# Patient Record
Sex: Female | Born: 1985 | Race: White | Hispanic: No | Marital: Single
Health system: Midwestern US, Community
[De-identification: ages and names within clinical notes are randomized; demographics above are authoritative.]

## PROBLEM LIST (undated history)

## (undated) DIAGNOSIS — F1910 Other psychoactive substance abuse, uncomplicated: Secondary | ICD-10-CM

## (undated) DIAGNOSIS — L0291 Cutaneous abscess, unspecified: Secondary | ICD-10-CM

## (undated) DIAGNOSIS — J45909 Unspecified asthma, uncomplicated: Secondary | ICD-10-CM

---

## 2016-10-31 ENCOUNTER — Inpatient Hospital Stay
Admit: 2016-10-31 | Discharge: 2016-10-31 | Disposition: A | Discharge status: Rehabilitation Facility | Attending: Aerospace Medicine

## 2016-10-31 DIAGNOSIS — F1123 Opioid dependence with withdrawal: Secondary | ICD-10-CM

## 2016-10-31 MED ORDER — CLONIDINE 0.1 MG TAB
0.1 mg | ORAL | Status: DC
Start: 2016-10-31 — End: 2016-10-31
  Administered 2016-10-31: 19:00:00 via ORAL

## 2016-10-31 MED ORDER — HYDROXYZINE 25 MG TAB
25 mg | ORAL | Status: DC
Start: 2016-10-31 — End: 2016-10-31

## 2016-10-31 MED ORDER — CLONIDINE 0.1 MG TAB
0.1 mg | ORAL | Status: DC
Start: 2016-10-31 — End: 2016-10-31

## 2016-10-31 MED ORDER — PROMETHAZINE 25 MG TAB
25 mg | ORAL | Status: DC
Start: 2016-10-31 — End: 2016-10-31

## 2016-10-31 MED ORDER — DICYCLOMINE 10 MG CAP
10 mg | ORAL | Status: DC
Start: 2016-10-31 — End: 2016-10-31

## 2016-10-31 MED FILL — PROMETHAZINE 25 MG TAB: 25 mg | ORAL | Qty: 4

## 2016-10-31 MED FILL — DICYCLOMINE 10 MG CAP: 10 mg | ORAL | Qty: 3

## 2016-10-31 MED FILL — CLONIDINE 0.1 MG TAB: 0.1 mg | ORAL | Qty: 5

## 2016-10-31 MED FILL — HYDROXYZINE 25 MG TAB: 25 mg | ORAL | Qty: 8

## 2016-10-31 MED FILL — CLONIDINE 0.1 MG TAB: 0.1 mg | ORAL | Qty: 1

## 2016-10-31 NOTE — ED Triage Notes (Signed)
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

## 2016-10-31 NOTE — ED Provider Notes (Signed)
HPI Comments:  31 year old female presents emergency department with her mother hoping to get medically screened for the [facility] facility.  Patient has a history of heroin use.  Last use last night.  Admits to occasional marijuana and alcohol.  Also smokes cigarettes.  Denies any other illicit   Drug use.  Patient was in a program at [facility] 1 year ago.  Had short.  Of sobriety afterward.  Patient has a history of asthma.  She has an albuterol MDI but has not had to use it recently.  She has not been on any recent antibiotics or steroids.  She denies any seizure history.  Denies any history of diabetes or hypertension.  Denies any history of sepsis or heart problems.  Denies pregnancy.    Patient is a 31 y.o. female presenting with intoxication. The history is provided by the patient and a parent. No language interpreter was used.   Alcohol intoxication   There areno confusion, no somnolence, no loss of consciousness, no seizures, no weakness, no agitation, no delusions, no hallucinations, no violence and no intoxication present at this time.  Suspected agents include heroin. Associated symptoms include nausea. Pertinent negatives include no fever and no vomiting. Associated medical issues include addiction treatment.        History reviewed. No pertinent past medical history.    History reviewed. No pertinent surgical history.      History reviewed. No pertinent family history.    Social History     Social History   ??? Marital status: SINGLE     Spouse name: N/A   ??? Number of children: N/A   ??? Years of education: N/A     Occupational History   ??? Not on file.     Social History Main Topics   ??? Smoking status: Current Every Day Smoker   ??? Smokeless tobacco: Not on file   ??? Alcohol use Yes   ??? Drug use: Yes     Special: Heroin   ??? Sexual activity: Not Currently     Other Topics Concern   ??? Not on file     Social History Narrative   ??? No narrative on file          ALLERGIES: Review of patient's allergies indicates no known allergies.    Review of Systems   Constitutional: Negative for chills, diaphoresis and fever.   HENT: Negative for congestion, facial swelling, sinus pain, sinus pressure, sore throat, trouble swallowing and voice change.    Eyes: Negative for photophobia, pain and visual disturbance.   Respiratory: Negative for apnea, cough, choking, chest tightness, shortness of breath, wheezing and stridor.    Cardiovascular: Negative for chest pain, palpitations and leg swelling.   Gastrointestinal: Positive for nausea. Negative for abdominal pain, constipation, diarrhea and vomiting.   Genitourinary: Negative for difficulty urinating, dysuria, flank pain, menstrual problem and pelvic pain.   Musculoskeletal: Negative for arthralgias, back pain, gait problem, joint swelling, myalgias, neck pain and neck stiffness.   Skin: Negative for color change, pallor, rash and wound.   Neurological: Negative for dizziness, tremors, seizures, loss of consciousness, syncope, facial asymmetry, speech difficulty, weakness, light-headedness, numbness and headaches.   Hematological: Negative for adenopathy. Does not bruise/bleed easily.   Psychiatric/Behavioral: Negative for agitation, confusion, dysphoric mood and hallucinations. The patient is not nervous/anxious.        Vitals:    10/31/16 1157   BP: 109/61   Pulse: 85   Resp: 20  Temp: 98.3 ??F (36.8 ??C)   SpO2: 99%   Weight: 52.2 kg (115 lb)            Physical Exam   Constitutional: She is oriented to person, place, and time. She appears well-developed and well-nourished. No distress.   HENT:   Head: Normocephalic and atraumatic.   Nose: Nose normal.   Mouth/Throat: Oropharynx is clear and moist.   Eyes: Conjunctivae and EOM are normal. Pupils are equal, round, and reactive to light.   Neck: Normal range of motion. Neck supple.   Cardiovascular: Normal rate, regular rhythm, normal heart sounds and intact distal pulses.     No murmur heard.  Pulmonary/Chest: Effort normal and breath sounds normal. No respiratory distress. She has no wheezes. She has no rales. She exhibits no tenderness.   Abdominal: Soft. Bowel sounds are normal. There is no tenderness. There is no rebound and no guarding.   Musculoskeletal: Normal range of motion. She exhibits no edema, tenderness or deformity.   Lymphadenopathy:     She has no cervical adenopathy.   Neurological: She is alert and oriented to person, place, and time. No cranial nerve deficit. She exhibits normal muscle tone. Coordination normal.   Skin: Skin is warm and dry. No rash noted. She is not diaphoretic. No erythema. No pallor.   Bilateral forearms with scattered track marks.No erythema, excess warmth, fluctuance or drainage noted.   Psychiatric: She has a normal mood and affect. Her behavior is normal. Judgment and thought content normal.   Nursing note and vitals reviewed.       MDM  Number of Diagnoses or Management Options  Opioid withdrawal [facility]):   Diagnosis management comments:   31 year old female seeking help with detoxification from heroin use.  Patient has been in touch with [facility] already.  Here for medical clearing.  Felt medically cleared for detox.  Will for note and recommendations to [facility].        ED Course   Comment By Time   Patient has been accepted at [facility].  Will provide to go medications. Drucie Opitz, DO 09/26 1427       Procedures    Medications given in the ED:  Medications - No data to display    Diagnosis:    ICD-10-CM ICD-9-CM   1. 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      Medications given in the ED:  Medications - No data to display    Diagnosis:    ICD-10-CM ICD-9-CM   1. 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

## 2016-10-31 NOTE — ED Notes (Signed)
Tara Ho presents to the ED for medical clearance and acceptance to [facility]. Drug of choice is heroin, IV, last use was last night. Denies regular alcohol or recreational benzo use. No history of seizures.

## 2018-11-13 ENCOUNTER — Other Ambulatory Visit: Payer: Self-pay

## 2018-11-13 ENCOUNTER — Encounter (HOSPITAL_COMMUNITY): Payer: Self-pay | Admitting: Emergency Medicine

## 2018-11-13 ENCOUNTER — Inpatient Hospital Stay (HOSPITAL_COMMUNITY)
Admission: EM | Admit: 2018-11-13 | Discharge: 2018-11-20 | DRG: 854 | Disposition: A | Discharge status: Home / Self Care | Payer: Medicaid - Out of State | Attending: Internal Medicine | Admitting: Internal Medicine

## 2018-11-13 ENCOUNTER — Emergency Department (HOSPITAL_COMMUNITY): Payer: Medicaid - Out of State

## 2018-11-13 DIAGNOSIS — Z20828 Contact with and (suspected) exposure to other viral communicable diseases: Secondary | ICD-10-CM | POA: Diagnosis present

## 2018-11-13 DIAGNOSIS — Z681 Body mass index (BMI) 19 or less, adult: Secondary | ICD-10-CM | POA: Diagnosis not present

## 2018-11-13 DIAGNOSIS — E440 Moderate protein-calorie malnutrition: Secondary | ICD-10-CM | POA: Diagnosis present

## 2018-11-13 DIAGNOSIS — Z720 Tobacco use: Secondary | ICD-10-CM

## 2018-11-13 DIAGNOSIS — E871 Hypo-osmolality and hyponatremia: Secondary | ICD-10-CM | POA: Diagnosis present

## 2018-11-13 DIAGNOSIS — L03213 Periorbital cellulitis: Secondary | ICD-10-CM | POA: Diagnosis present

## 2018-11-13 DIAGNOSIS — B9561 Methicillin susceptible Staphylococcus aureus infection as the cause of diseases classified elsewhere: Secondary | ICD-10-CM | POA: Diagnosis not present

## 2018-11-13 DIAGNOSIS — F17200 Nicotine dependence, unspecified, uncomplicated: Secondary | ICD-10-CM | POA: Diagnosis not present

## 2018-11-13 DIAGNOSIS — J4520 Mild intermittent asthma, uncomplicated: Secondary | ICD-10-CM | POA: Diagnosis not present

## 2018-11-13 DIAGNOSIS — R739 Hyperglycemia, unspecified: Secondary | ICD-10-CM | POA: Diagnosis present

## 2018-11-13 DIAGNOSIS — F1910 Other psychoactive substance abuse, uncomplicated: Secondary | ICD-10-CM | POA: Diagnosis present

## 2018-11-13 DIAGNOSIS — D751 Secondary polycythemia: Secondary | ICD-10-CM | POA: Diagnosis present

## 2018-11-13 DIAGNOSIS — B9562 Methicillin resistant Staphylococcus aureus infection as the cause of diseases classified elsewhere: Secondary | ICD-10-CM | POA: Diagnosis not present

## 2018-11-13 DIAGNOSIS — F1990 Other psychoactive substance use, unspecified, uncomplicated: Secondary | ICD-10-CM | POA: Diagnosis not present

## 2018-11-13 DIAGNOSIS — R7881 Bacteremia: Secondary | ICD-10-CM | POA: Diagnosis present

## 2018-11-13 DIAGNOSIS — B9620 Unspecified Escherichia coli [E. coli] as the cause of diseases classified elsewhere: Secondary | ICD-10-CM | POA: Diagnosis present

## 2018-11-13 DIAGNOSIS — R590 Localized enlarged lymph nodes: Secondary | ICD-10-CM | POA: Diagnosis present

## 2018-11-13 DIAGNOSIS — J45909 Unspecified asthma, uncomplicated: Secondary | ICD-10-CM | POA: Diagnosis present

## 2018-11-13 DIAGNOSIS — R8271 Bacteriuria: Secondary | ICD-10-CM | POA: Diagnosis not present

## 2018-11-13 DIAGNOSIS — E872 Acidosis: Secondary | ICD-10-CM | POA: Diagnosis present

## 2018-11-13 DIAGNOSIS — A4902 Methicillin resistant Staphylococcus aureus infection, unspecified site: Secondary | ICD-10-CM | POA: Diagnosis not present

## 2018-11-13 DIAGNOSIS — A4102 Sepsis due to Methicillin resistant Staphylococcus aureus: Secondary | ICD-10-CM | POA: Diagnosis present

## 2018-11-13 DIAGNOSIS — L03211 Cellulitis of face: Secondary | ICD-10-CM | POA: Diagnosis present

## 2018-11-13 DIAGNOSIS — K0889 Other specified disorders of teeth and supporting structures: Secondary | ICD-10-CM | POA: Diagnosis not present

## 2018-11-13 DIAGNOSIS — D72829 Elevated white blood cell count, unspecified: Secondary | ICD-10-CM | POA: Diagnosis not present

## 2018-11-13 DIAGNOSIS — K047 Periapical abscess without sinus: Secondary | ICD-10-CM | POA: Diagnosis present

## 2018-11-13 DIAGNOSIS — R768 Other specified abnormal immunological findings in serum: Secondary | ICD-10-CM | POA: Diagnosis not present

## 2018-11-13 DIAGNOSIS — Q211 Atrial septal defect: Secondary | ICD-10-CM | POA: Diagnosis not present

## 2018-11-13 DIAGNOSIS — L0201 Cutaneous abscess of face: Secondary | ICD-10-CM | POA: Diagnosis present

## 2018-11-13 DIAGNOSIS — J45998 Other asthma: Secondary | ICD-10-CM | POA: Diagnosis not present

## 2018-11-13 DIAGNOSIS — R000 Tachycardia, unspecified: Secondary | ICD-10-CM | POA: Diagnosis not present

## 2018-11-13 DIAGNOSIS — E878 Other disorders of electrolyte and fluid balance, not elsewhere classified: Secondary | ICD-10-CM | POA: Diagnosis present

## 2018-11-13 DIAGNOSIS — K029 Dental caries, unspecified: Secondary | ICD-10-CM | POA: Diagnosis present

## 2018-11-13 HISTORY — DX: Unspecified asthma, uncomplicated: J45.909

## 2018-11-13 HISTORY — DX: Cutaneous abscess, unspecified: L02.91

## 2018-11-13 LAB — LACTIC ACID, PLASMA
Lactic Acid, Venous: 2.1 mmol/L (ref 0.5–1.9)
Lactic Acid, Venous: 1.4 mmol/L (ref 0.5–1.9)
Lactic Acid, Venous: 1.4 mmol/L (ref 0.5–1.9)

## 2018-11-13 LAB — URINALYSIS, ROUTINE W REFLEX MICROSCOPIC
Bacteria, UA: NONE SEEN
Bilirubin Urine: NEGATIVE
Glucose, UA: NEGATIVE mg/dL
Ketones, ur: NEGATIVE mg/dL
Leukocytes,Ua: NEGATIVE
Nitrite: NEGATIVE
Protein, ur: NEGATIVE mg/dL
Specific Gravity, Urine: 1.015 (ref 1.005–1.030)
pH: 7 (ref 5.0–8.0)

## 2018-11-13 LAB — SARS CORONAVIRUS 2 BY RT PCR (HOSPITAL ORDER, PERFORMED IN ~~LOC~~ HOSPITAL LAB): SARS Coronavirus 2: NEGATIVE

## 2018-11-13 LAB — CBC WITH DIFFERENTIAL/PLATELET
Abs Immature Granulocytes: 0.24 10*3/uL — ABNORMAL HIGH (ref 0.00–0.07)
Basophils Absolute: 0.1 10*3/uL (ref 0.0–0.1)
Basophils Relative: 0 %
Eosinophils Absolute: 0 10*3/uL (ref 0.0–0.5)
Eosinophils Relative: 0 %
HCT: 46.5 % — ABNORMAL HIGH (ref 36.0–46.0)
Hemoglobin: 15.2 g/dL — ABNORMAL HIGH (ref 12.0–15.0)
Immature Granulocytes: 1 %
Lymphocytes Relative: 8 %
Lymphs Abs: 2.7 10*3/uL (ref 0.7–4.0)
MCH: 29.3 pg (ref 26.0–34.0)
MCHC: 32.7 g/dL (ref 30.0–36.0)
MCV: 89.6 fL (ref 80.0–100.0)
Monocytes Absolute: 2.7 10*3/uL — ABNORMAL HIGH (ref 0.1–1.0)
Monocytes Relative: 8 %
Neutro Abs: 28.9 10*3/uL — ABNORMAL HIGH (ref 1.7–7.7)
Neutrophils Relative %: 83 %
Platelets: 343 10*3/uL (ref 150–400)
RBC: 5.19 MIL/uL — ABNORMAL HIGH (ref 3.87–5.11)
RDW: 14.6 % (ref 11.5–15.5)
WBC: 34.7 10*3/uL — ABNORMAL HIGH (ref 4.0–10.5)
nRBC: 0 % (ref 0.0–0.2)

## 2018-11-13 LAB — COMPREHENSIVE METABOLIC PANEL
ALT: 13 U/L (ref 0–44)
AST: 23 U/L (ref 15–41)
Albumin: 4.3 g/dL (ref 3.5–5.0)
Alkaline Phosphatase: 104 U/L (ref 38–126)
Anion gap: 13 (ref 5–15)
BUN: 7 mg/dL (ref 6–20)
CO2: 25 mmol/L (ref 22–32)
Calcium: 9.7 mg/dL (ref 8.9–10.3)
Chloride: 94 mmol/L — ABNORMAL LOW (ref 98–111)
Creatinine, Ser: 0.55 mg/dL (ref 0.44–1.00)
GFR calc Af Amer: >60 mL/min (ref >60)
GFR calc non Af Amer: >60 mL/min (ref >60)
Glucose, Bld: 156 mg/dL — ABNORMAL HIGH (ref 70–99)
Potassium: 3.8 mmol/L (ref 3.5–5.1)
Sodium: 132 mmol/L — ABNORMAL LOW (ref 135–145)
Total Bilirubin: 0.6 mg/dL (ref 0.3–1.2)
Total Protein: 9 g/dL — ABNORMAL HIGH (ref 6.5–8.1)

## 2018-11-13 LAB — I-STAT BETA HCG BLOOD, ED (MC, WL, AP ONLY): I-stat hCG, quantitative: <5 m[IU]/mL (ref <5)

## 2018-11-13 MED ORDER — VANCOMYCIN HCL IN DEXTROSE 1-5 GM/200ML-% IV SOLN
1000.0000 mg | Freq: Once | INTRAVENOUS | Status: AC
  Administered 2018-11-13: 17:00:00 1000 mg via INTRAVENOUS
  Filled 2018-11-13: qty 200

## 2018-11-13 MED ORDER — ACETAMINOPHEN 650 MG RE SUPP: 650.0000 mg | Freq: Four times a day (QID) | RECTAL | Status: DC | PRN

## 2018-11-13 MED ORDER — FENTANYL CITRATE (PF) 100 MCG/2ML IJ SOLN
50.0000 ug | Freq: Once | INTRAMUSCULAR | Status: AC
  Administered 2018-11-13: 50 ug via INTRAVENOUS
  Filled 2018-11-13: qty 2

## 2018-11-13 MED ORDER — TRAMADOL HCL 50 MG PO TABS
50.0000 mg | ORAL_TABLET | Freq: Four times a day (QID) | ORAL | Status: DC | PRN
  Administered 2018-11-14 – 2018-11-20 (×5): 50 mg via ORAL
  Filled 2018-11-13 (×5): qty 1

## 2018-11-13 MED ORDER — MORPHINE SULFATE (PF) 2 MG/ML IV SOLN
2.0000 mg | INTRAVENOUS | Status: DC | PRN
  Administered 2018-11-13 – 2018-11-18 (×26): 2 mg via INTRAVENOUS
  Filled 2018-11-13 (×25): qty 1

## 2018-11-13 MED ORDER — SODIUM CHLORIDE 0.9 % IV SOLN
INTRAVENOUS | Status: DC
  Administered 2018-11-13 – 2018-11-17 (×8): via INTRAVENOUS

## 2018-11-13 MED ORDER — ONDANSETRON HCL 4 MG PO TABS: 4.0000 mg | ORAL_TABLET | Freq: Four times a day (QID) | ORAL | Status: DC | PRN

## 2018-11-13 MED ORDER — ENOXAPARIN SODIUM 40 MG/0.4ML ~~LOC~~ SOLN
40.0000 mg | SUBCUTANEOUS | Status: DC
  Administered 2018-11-13: 22:00:00 40 mg via SUBCUTANEOUS
  Filled 2018-11-13 (×6): qty 0.4

## 2018-11-13 MED ORDER — ONDANSETRON HCL 4 MG/2ML IJ SOLN
4.0000 mg | Freq: Four times a day (QID) | INTRAMUSCULAR | Status: DC | PRN
  Administered 2018-11-16: 4 mg via INTRAVENOUS

## 2018-11-13 MED ORDER — IOHEXOL 300 MG/ML  SOLN
75.0000 mL | Freq: Once | INTRAMUSCULAR | Status: AC | PRN
  Administered 2018-11-13: 17:00:00 75 mL via INTRAVENOUS

## 2018-11-13 MED ORDER — SODIUM CHLORIDE 0.9 % IV SOLN
2.0000 g | Freq: Once | INTRAVENOUS | Status: AC
  Administered 2018-11-13: 2 g via INTRAVENOUS
  Filled 2018-11-13: qty 20

## 2018-11-13 MED ORDER — VANCOMYCIN HCL 500 MG IV SOLR
500.0000 mg | Freq: Two times a day (BID) | INTRAVENOUS | Status: DC
  Administered 2018-11-14 – 2018-11-16 (×6): 500 mg via INTRAVENOUS
  Filled 2018-11-13 (×9): qty 500

## 2018-11-13 MED ORDER — SODIUM CHLORIDE (PF) 0.9 % IJ SOLN
INTRAMUSCULAR | Status: AC
  Filled 2018-11-13: qty 50

## 2018-11-13 MED ORDER — SODIUM CHLORIDE 0.9 % IV BOLUS
1000.0000 mL | Freq: Once | INTRAVENOUS | Status: AC
  Administered 2018-11-13: 17:00:00 1000 mL via INTRAVENOUS

## 2018-11-13 MED ORDER — MORPHINE SULFATE (PF) 2 MG/ML IV SOLN
INTRAVENOUS | Status: AC
  Filled 2018-11-13: qty 1

## 2018-11-13 MED ORDER — ACETAMINOPHEN 325 MG PO TABS: 650.0000 mg | ORAL_TABLET | Freq: Four times a day (QID) | ORAL | Status: DC | PRN

## 2018-11-13 NOTE — ED Notes (Signed)
Date and time results received: 11/13/18 1630   Test: lactic Critical Value: 2.1  Name of Provider Notified: [place]

## 2018-11-13 NOTE — Progress Notes (Signed)
A consult was received from an ED physician for vancomycin per pharmacy dosing.  The patient's profile has been reviewed for ht/wt/allergies/indication/available labs.    Ht/wt ordered entered STAT. No weight in chart.  A one time order has been placed for vancomycin 1000 mg IV once.  Further antibiotics/pharmacy consults should be ordered by admitting physician if indicated.       Thank you, Lenis Noon, PharmD 11/13/2018  4:31 PM

## 2018-11-13 NOTE — ED Provider Notes (Addendum)
[facility] DEPT Provider Note   CSN: [id number] Arrival date & time: 11/13/18  1433     History   Chief Complaint Chief Complaint  Patient presents with  . Facial Swelling    HPI Amanda Ray is a 33 y.o. female with past medical history of asthma, IV drug use, presenting to the emergency department with 2 days of worsening right-sided facial swelling and pain.  She states initially it started out as a small pustule just lateral to her mouth on her right cheek.  She popped this like a pimple though noticed the swelling and redness gradually worsened over the last 2 days.  She noted significant swelling to her right face with associated pain.  She states she has felt warm with some chills though has not appreciated a fever at home.  She denies overall feeling ill.  She does endorse IV heroin use, last use today.  She mostly injects to her left neck.  Per chart review patient was seen at [facility] ED on 10/06/2018 for preseptal cellulitis of the right eye though pt states that completely resolved with antibiotics and is not the same episode.     The history is provided by the patient and medical records.    Past Medical History:  Diagnosis Date  . Abscess   . Asthma     There are no active problems to display for this patient.   History reviewed. No pertinent surgical history.   OB History   No obstetric history on file.      Home Medications    Prior to Admission medications   Not on File    Family History No family history on file.  Social History Social History   Tobacco Use  . Smoking status: Not on file  Substance Use Topics  . Alcohol use: Not on file  . Drug use: Not on file     Allergies   Patient has no known allergies.   Review of Systems Review of Systems  All other systems reviewed and are negative.    Physical Exam Updated Vital Signs BP 126/86   Pulse (!) 130   Temp 99 F (37.2 C)   Resp (!) 23   Ht 5'  5" (1.651 m)   Wt 45.4 kg   LMP 10/30/2018   SpO2 100%   BMI 16.64 kg/m   Physical Exam Vitals signs and nursing note reviewed.  Constitutional:      General: She is not in acute distress.    Appearance: She is well-developed.  HENT:     Head: Normocephalic and atraumatic.     Comments: Right face with significant swelling and erythema.  The swelling extends from the right mandibular region all the way up to the right inferior preseptal region.  Some erythema that extends to the right upper lid though there is very minimal swelling to the upper lid.  There is tenderness and induration to the right cheek.  No fluctuance or active drainage.  The pharynx is clear without swelling.  Patient is tolerating secretions.  There is anterior cervical and submental adenopathy present. Eyes:     Conjunctiva/sclera: Conjunctivae normal.  Cardiovascular:     Rate and Rhythm: Regular rhythm. Tachycardia present.  Pulmonary:     Effort: Pulmonary effort is normal. No respiratory distress.     Breath sounds: Normal breath sounds. No stridor.  Abdominal:     General: Bowel sounds are normal.     Palpations: Abdomen is soft.  Tenderness: There is no abdominal tenderness.  Skin:    General: Skin is warm.  Neurological:     Mental Status: She is alert.  Psychiatric:        Behavior: Behavior normal.        ED Treatments / Results  Labs (all labs ordered are listed, but only abnormal results are displayed) Labs Reviewed  LACTIC ACID, PLASMA - Abnormal; Notable for the following components:      Result Value   Lactic Acid, Venous 2.1 (*)    All other components within normal limits  COMPREHENSIVE METABOLIC PANEL - Abnormal; Notable for the following components:   Sodium 132 (*)    Chloride 94 (*)    Glucose, Bld 156 (*)    Total Protein 9.0 (*)    All other components within normal limits  CBC WITH DIFFERENTIAL/PLATELET - Abnormal; Notable for the following components:   WBC 34.7 (*)     RBC 5.19 (*)    Hemoglobin 15.2 (*)    HCT 46.5 (*)    Neutro Abs 28.9 (*)    Monocytes Absolute 2.7 (*)    Abs Immature Granulocytes 0.24 (*)    All other components within normal limits  CULTURE, BLOOD (ROUTINE X 2)  CULTURE, BLOOD (ROUTINE X 2)  URINE CULTURE  LACTIC ACID, PLASMA  URINALYSIS, ROUTINE W REFLEX MICROSCOPIC  I-STAT BETA HCG BLOOD, ED ([facility], [facility], [facility] ONLY)    EKG EKG Interpretation  Date/Time:  Thursday November 13 2018 16:15:48 EDT Ventricular Rate:  132 PR Interval:    QRS Duration: 96 QT Interval:  302 QTC Calculation: 448 R Axis:   92 Text Interpretation:  Sinus tachycardia Borderline right axis deviation Borderline T wave abnormalities Confirmed by Kennis CarinaBero, Michael [phone] on 11/13/2018 6:20:48 PM   Radiology Ct Maxillofacial W Contrast  Result Date: 11/13/2018 CLINICAL DATA:  Cellulitis of face. Additional history provided: Patient reports temple to right side of face since yesterday, swelling started last night. EXAM: CT MAXILLOFACIAL WITH CONTRAST TECHNIQUE: Multidetector CT imaging of the maxillofacial structures was performed with intravenous contrast. Multiplanar CT image reconstructions were also generated. CONTRAST:  75mL OMNIPAQUE IOHEXOL 300 MG/ML  SOLN COMPARISON:  No pertinent prior studies available for comparison. FINDINGS: Osseous: There are multiple carious teeth. Subtle periapical lucency surrounding the left lower second premolar and first molar. Orbits: Right periorbital cellulitis. No definite evidence of postseptal extension on the current exam. Sinuses: No significant paranasal sinus disease or mastoid effusion. Soft tissues: There is severe right maxillofacial soft tissue swelling and stranding consistent with cellulitis which extends to the right periorbital region. There is a subtle circumscribed low-attenuation region with peripheral enhancement in the right cheek soft tissues measuring 2.8 x 1.0 x 1.2 cm (AP x TV x CC) (series 3, image 60)  (series 7, image 30). This may reflect very early abscess formation and extends anteriorly toward the right cheek skin surface (series 3, image 65). Cellulitis changes also extend to the right perimandibular region and upper right neck. Asymmetric prominence of the right parotid gland may reflect a reactive parotiditis. No appreciable mass or swelling within the nasopharynx, oral cavity, oropharynx, hypopharynx or larynx. Enlarged right cervical chain lymph nodes, likely reactive. An index right level II lymph node measures 16 mm in short axis Limited intracranial: Unremarkable IMPRESSION: 1. Severe right maxillofacial cellulitis extending to the right periorbital, right perimandibular and right upper neck soft tissues. No evidence of postseptal orbital extension on the current examination. 2. 2.8 cm subtle region  of low attenuation with peripheral enhancement in the right cheek soft tissues, as described and which may reflect very early abscess formation. 3. Asymmetric prominence of the right parotid gland, which may reflect reactive parotiditis. Clinical correlation is recommended. 4. Right upper cervical lymphadenopathy, likely reactive. Electronically Signed   By: Jackey Loge   On: 11/13/2018 17:51    Procedures .Critical Care Performed by: Mariesa Grieder, [place] N, PA-C Authorized by: Eulonda Andalon, [place] N, PA-C   Critical care provider statement:    Critical care time (minutes):  45   Critical care time was exclusive of:  Teaching time and separately billable procedures and treating other patients   Critical care was necessary to treat or prevent imminent or life-threatening deterioration of the following conditions:  Sepsis   Critical care was time spent personally by me on the following activities:  Discussions with consultants, evaluation of patient's response to treatment, examination of patient, ordering and performing treatments and interventions, ordering and review of laboratory studies, ordering  and review of radiographic studies, pulse oximetry, re-evaluation of patient's condition, obtaining history from patient or surrogate and review of old charts   I assumed direction of critical care for this patient from another provider in my specialty: no     (including critical care time)  Medications Ordered in ED Medications  cefTRIAXone (ROCEPHIN) 2 g in sodium chloride 0.9 % 100 mL IVPB (has no administration in time range)  sodium chloride (PF) 0.9 % injection (has no administration in time range)  vancomycin (VANCOCIN) IVPB 1000 mg/200 mL premix (1,000 mg Intravenous New Bag/Given 11/13/18 1631)  sodium chloride 0.9 % bolus 1,000 mL (1,000 mLs Intravenous Bolus from Bag 11/13/18 1632)  fentaNYL (SUBLIMAZE) injection 50 mcg (50 mcg Intravenous Given 11/13/18 1631)  iohexol (OMNIPAQUE) 300 MG/ML solution 75 mL (75 mLs Intravenous Contrast Given 11/13/18 1702)     Initial Impression / Assessment and Plan / ED Course  I have reviewed the triage vital signs and the nursing notes.  Pertinent labs & imaging results that were available during my care of the patient were reviewed by me and considered in my medical decision making (see chart for details).        Patient is an IV heroin user, presenting with 2 days of progressively worsening right face swelling and pain.  On arrival, patient's temperature is 56 F, she is significantly tachycardic at 140s with soft pressures of 103/93.  She has large swelling and cellulitis to the right face.  Code sepsis was initiated, IV antibiotics, as vancomycin and Rocephin.  Pain medication.  CT scan reveals her cellulitis to the right cheek with possible early abscess.  CBC is significantly elevated at 35.  Lactic acidosis of 2.1.  Blood cultures sent.  Patient is admitted to the hospitalist service for further management.  Patient was discussed with and evaluated by Dr. Pilar Plate.  The patient appears reasonably stabilized for admission considering the  current resources, flow, and capabilities available in the ED at this time, and I doubt any other [facility] requiring further screening and/or treatment in the ED prior to admission.  Final Clinical Impressions(s) / ED Diagnoses   Final diagnoses:  Facial cellulitis    ED Discharge Orders    None       Uchechukwu Dhawan, [place] N, PA-C 11/13/18 1821    Ellington Greenslade, [place] N, PA-C 11/13/18 1824    Sabas Sous, MD 11/15/18 1050

## 2018-11-13 NOTE — ED Notes (Signed)
ED TO INPATIENT HANDOFF REPORT  ED Nurse Name and Phone #:   S Name/Age/Gender Amanda Ray 33 y.o. female Room/Bed: WA01/WA01  Code Status   Code Status: Full Code  Home/SNF/Other Home Patient oriented to: self, place, time and situation Is this baseline? Yes   Triage Complete: Triage complete  Chief Complaint facial swelling/ Abscess   Triage Note Patient reports pimple to right side of face since yesterday. States swelling started last night. Hx abscess.    Allergies No Known Allergies  Level of Care/Admitting Diagnosis ED Disposition    ED Disposition Condition Comment   Admit  Hospital Area: [facility] [[id number]]  Level of Care: Med-Surg [16]  Covid Evaluation: Asymptomatic Screening Protocol (No Symptoms)  Diagnosis: Facial cellulitis [[id number]]  Admitting Physician: Anselm Jungling [[id number]]  Attending Physician: Anselm Jungling [[id number]]  Estimated length of stay: past midnight tomorrow  Certification:: I certify this patient will need inpatient services for at least 2 midnights  PT Class (Do Not Modify): Inpatient [101]  PT Acc Code (Do Not Modify): Private [1]       B Medical/Surgery History Past Medical History:  Diagnosis Date  . Abscess   . Asthma    History reviewed. No pertinent surgical history.   A IV Location/Drains/Wounds Patient Lines/Drains/Airways Status   Active Line/Drains/Airways    Name:   Placement date:   Placement time:   Site:   Days:   Peripheral IV 11/13/18 Left Antecubital   11/13/18    1600    Antecubital   less than 1          Intake/Output Last 24 hours  Intake/Output Summary (Last 24 hours) at 11/13/2018 2155 Last data filed at 11/13/2018 1829 Gross per 24 hour  Intake 200 ml  Output -  Net 200 ml    Labs/Imaging Results for orders placed or performed during the hospital encounter of 11/13/18 (from the past 48 hour(s))  Lactic acid, plasma     Status: Abnormal   Collection Time: 11/13/18  3:40 PM   Result Value Ref Range   Lactic Acid, Venous 2.1 (HH) 0.5 - 1.9 mmol/L    Comment: CRITICAL RESULT CALLED TO, READ BACK BY AND VERIFIED WITH: M.GRIFFIN AT 1630 ON 11/13/18 BY N.THOMPSON Performed at [facility], [address] [place], [place] [address]   Comprehensive metabolic panel     Status: Abnormal   Collection Time: 11/13/18  3:40 PM  Result Value Ref Range   Sodium 132 (L) 135 - 145 mmol/L   Potassium 3.8 3.5 - 5.1 mmol/L   Chloride 94 (L) 98 - 111 mmol/L   CO2 25 22 - 32 mmol/L   Glucose, Bld 156 (H) 70 - 99 mg/dL   BUN 7 6 - 20 mg/dL   Creatinine, Ser 9.67 0.44 - 1.00 mg/dL   Calcium 9.7 8.9 - 89.3 mg/dL   Total Protein 9.0 (H) 6.5 - 8.1 g/dL   Albumin 4.3 3.5 - 5.0 g/dL   AST 23 15 - 41 U/L   ALT 13 0 - 44 U/L   Alkaline Phosphatase 104 38 - 126 U/L   Total Bilirubin 0.6 0.3 - 1.2 mg/dL   GFR calc non Af Amer >60 >60 mL/min   GFR calc Af Amer >60 >60 mL/min   Anion gap 13 5 - 15    Comment: Performed at [facility], [address], [place], [place] [address]  CBC with Differential     Status:  Abnormal   Collection Time: 11/13/18  3:40 PM  Result Value Ref Range   WBC 34.7 (H) 4.0 - 10.5 K/uL   RBC 5.19 (H) 3.87 - 5.11 MIL/uL   Hemoglobin 15.2 (H) 12.0 - 15.0 g/dL   HCT 46.5 (H) 36.0 - 46.0 %   MCV 89.6 80.0 - 100.0 fL   MCH 29.3 26.0 - 34.0 pg   MCHC 32.7 30.0 - 36.0 g/dL   RDW 14.6 11.5 - 15.5 %   Platelets 343 150 - 400 K/uL   nRBC 0.0 0.0 - 0.2 %   Neutrophils Relative % 83 %   Neutro Abs 28.9 (H) 1.7 - 7.7 K/uL   Lymphocytes Relative 8 %   Lymphs Abs 2.7 0.7 - 4.0 K/uL   Monocytes Relative 8 %   Monocytes Absolute 2.7 (H) 0.1 - 1.0 K/uL   Eosinophils Relative 0 %   Eosinophils Absolute 0.0 0.0 - 0.5 K/uL   Basophils Relative 0 %   Basophils Absolute 0.1 0.0 - 0.1 K/uL   WBC Morphology TOXIC GRANULATION    Immature Granulocytes 1 %   Abs Immature Granulocytes 0.24 (H) 0.00 - 0.07 K/uL    Comment: Performed  at [facility], [place] [address] [place], [place] [address]  Urinalysis, Routine w reflex microscopic     Status: Abnormal   Collection Time: 11/13/18  3:40 PM  Result Value Ref Range   Color, Urine YELLOW YELLOW   APPearance CLEAR CLEAR   Specific Gravity, Urine 1.015 1.005 - 1.030   pH 7.0 5.0 - 8.0   Glucose, UA NEGATIVE NEGATIVE mg/dL   Hgb urine dipstick SMALL (A) NEGATIVE   Bilirubin Urine NEGATIVE NEGATIVE   Ketones, ur NEGATIVE NEGATIVE mg/dL   Protein, ur NEGATIVE NEGATIVE mg/dL   Nitrite NEGATIVE NEGATIVE   Leukocytes,Ua NEGATIVE NEGATIVE   RBC / HPF 0-5 0 - 5 RBC/hpf   WBC, UA 0-5 0 - 5 WBC/hpf   Bacteria, UA NONE SEEN NONE SEEN   Squamous Epithelial / LPF 0-5 0 - 5    Comment: Performed at [facility], [place] [address], [place], [place] [address]  I-Stat beta hCG blood, ED     Status: None   Collection Time: 11/13/18  3:58 PM  Result Value Ref Range   I-stat hCG, quantitative <5.0 <5 mIU/mL   Comment 3            Comment:   GEST. AGE      CONC.  (mIU/mL)   <=1 WEEK        5 - 50     2 WEEKS       50 - 500     3 WEEKS       100 - 10,000     4 WEEKS     1,000 - 30,000        FEMALE AND NON-PREGNANT FEMALE:     LESS THAN 5 mIU/mL   Blood Culture (routine x 2)     Status: None (Preliminary result)   Collection Time: 11/13/18  4:36 PM   Specimen: BLOOD RIGHT FOREARM  Result Value Ref Range   Specimen Description      BLOOD RIGHT FOREARM Performed at [facility], [place] [address] [place],  [address]    Special Requests      BOTTLES DRAWN AEROBIC ONLY Blood Culture results may not be optimal due to an inadequate volume of blood received in culture bottles Performed at [facility], [address] [place], [place] [address]    Culture PENDING    Report Status PENDING   SARS Coronavirus 2 by RT PCR (hospital order, performed in [facility] hospital lab) Nasopharyngeal Nasopharyngeal Swab     Status:  None   Collection Time: 11/13/18  6:44 PM   Specimen: Nasopharyngeal Swab  Result Value Ref Range   SARS Coronavirus 2 NEGATIVE NEGATIVE    Comment: (NOTE) If result is NEGATIVE SARS-CoV-2 target nucleic acids are NOT DETECTED. The SARS-CoV-2 RNA is generally detectable in upper and lower  respiratory specimens during the acute phase of infection. The lowest  concentration of SARS-CoV-2 viral copies this assay can detect is 250  copies / mL. A negative result does not preclude SARS-CoV-2 infection  and should not be used as the sole basis for treatment or other  patient management decisions.  A negative result may occur with  improper specimen collection / handling, submission of specimen other  than nasopharyngeal swab, presence of viral mutation(s) within the  areas targeted by this assay, and inadequate number of viral copies  (<250 copies / mL). A negative result must be combined with clinical  observations, patient history, and epidemiological information. If result is POSITIVE SARS-CoV-2 target nucleic acids are DETECTED. The SARS-CoV-2 RNA is generally detectable in upper and lower  respiratory specimens dur ing the acute phase of infection.  Positive  results are indicative of active infection with SARS-CoV-2.  Clinical  correlation with patient history and other diagnostic information is  necessary to determine patient infection status.  Positive results do  not rule out bacterial infection or co-infection with other viruses. If result is PRESUMPTIVE POSTIVE SARS-CoV-2 nucleic acids MAY BE PRESENT.   A presumptive positive result was obtained on the submitted specimen  and confirmed on repeat testing.  While 2019 novel coronavirus  (SARS-CoV-2) nucleic acids may be present in the submitted sample  additional confirmatory testing may be necessary for epidemiological  and / or clinical management purposes  to differentiate between  SARS-CoV-2 and other Sarbecovirus currently  known to infect humans.  If clinically indicated additional testing with an alternate test  methodology [phone]([id number]) is advised. The SARS-CoV-2 RNA is generally  detectable in upper and lower respiratory sp ecimens during the acute  phase of infection. The expected result is Negative. Fact Sheet for Patients:  BoilerBrush.com.cyhttps://www.fda.gov/media/136312/download Fact Sheet for Healthcare Providers: https://pope.com/https://www.fda.gov/media/136313/download This test is not yet approved or cleared by the [place] FDA and has been authorized for detection and/or diagnosis of SARS-CoV-2 by FDA under an Emergency Use Authorization (EUA).  This EUA will remain in effect (meaning this test can be used) for the duration of the COVID-19 declaration under Section 564(b)(1) of the Act, 21 U.S.C. section 360bbb-3(b)(1), unless the authorization is terminated or revoked sooner. Performed at [facility], [address] [place], [place] [address]   Lactic acid, plasma     Status: None   Collection Time: 11/13/18  7:05 PM  Result Value Ref Range   Lactic Acid, Venous 1.4 0.5 - 1.9 mmol/L    Comment: Performed at [facility], [address] [place], [place] [address]   Ct Maxillofacial W Contrast  Result Date: 11/13/2018 CLINICAL DATA:  Cellulitis of face. Additional history provided: Patient reports temple to right side of face since yesterday, swelling started last night. EXAM: CT MAXILLOFACIAL WITH CONTRAST TECHNIQUE: Multidetector CT imaging of the maxillofacial structures was performed with intravenous contrast. Multiplanar CT  image reconstructions were also generated. CONTRAST:  75mL OMNIPAQUE IOHEXOL 300 MG/ML  SOLN COMPARISON:  No pertinent prior studies available for comparison. FINDINGS: Osseous: There are multiple carious teeth. Subtle periapical lucency surrounding the left lower second premolar and first molar. Orbits: Right periorbital cellulitis. No definite evidence of  postseptal extension on the current exam. Sinuses: No significant paranasal sinus disease or mastoid effusion. Soft tissues: There is severe right maxillofacial soft tissue swelling and stranding consistent with cellulitis which extends to the right periorbital region. There is a subtle circumscribed low-attenuation region with peripheral enhancement in the right cheek soft tissues measuring 2.8 x 1.0 x 1.2 cm (AP x TV x CC) (series 3, image 60) (series 7, image 30). This may reflect very early abscess formation and extends anteriorly toward the right cheek skin surface (series 3, image 65). Cellulitis changes also extend to the right perimandibular region and upper right neck. Asymmetric prominence of the right parotid gland may reflect a reactive parotiditis. No appreciable mass or swelling within the nasopharynx, oral cavity, oropharynx, hypopharynx or larynx. Enlarged right cervical chain lymph nodes, likely reactive. An index right level II lymph node measures 16 mm in short axis Limited intracranial: Unremarkable IMPRESSION: 1. Severe right maxillofacial cellulitis extending to the right periorbital, right perimandibular and right upper neck soft tissues. No evidence of postseptal orbital extension on the current examination. 2. 2.8 cm subtle region of low attenuation with peripheral enhancement in the right cheek soft tissues, as described and which may reflect very early abscess formation. 3. Asymmetric prominence of the right parotid gland, which may reflect reactive parotiditis. Clinical correlation is recommended. 4. Right upper cervical lymphadenopathy, likely reactive. Electronically Signed   By: Jackey Loge   On: 11/13/2018 17:51    Pending Labs Unresulted Labs (From admission, onward)    Start     Ordered   11/14/18 0500  Basic metabolic panel  Tomorrow morning,   R     11/13/18 1940   11/14/18 0500  CBC  Tomorrow morning,   R     11/13/18 1940   11/14/18 0500  Creatinine, serum  Daily,   R      11/13/18 2113   11/13/18 1942  Lactic acid, plasma  STAT Now then every 3 hours,   R (with STAT occurrences)     11/13/18 1941   11/13/18 1938  HIV Antibody (routine testing w rflx)  (HIV Antibody (Routine testing w reflex) panel)  Once,   STAT     11/13/18 1940   11/13/18 1938  HIV4GL Save Tube  (HIV Antibody (Routine testing w reflex) panel)  Once,   STAT     11/13/18 1940   11/13/18 1611  Urine culture  Add-on,   AD     11/13/18 1610   11/13/18 1553  Blood Culture (routine x 2)  BLOOD CULTURE X 2,   STAT     11/13/18 1557          Vitals/Pain Today's Vitals   11/13/18 1900 11/13/18 1930 11/13/18 2000 11/13/18 2030  BP: (!) 135/91 (!) 137/92 126/84 (!) 115/56  Pulse: (!) 125 (!) 125 (!) 127 (!) 127  Resp: (!) 21  Temp:      SpO2: 100% 100% 99% 100%  Weight:      Height:      PainSc:        Isolation Precautions No active isolations  Medications Medications  sodium chloride (PF) 0.9 % injection (has no  administration in time range)  enoxaparin (LOVENOX) injection 40 mg (has no administration in time range)  acetaminophen (TYLENOL) tablet 650 mg (has no administration in time range)    Or  acetaminophen (TYLENOL) suppository 650 mg (has no administration in time range)  traMADol (ULTRAM) tablet 50 mg (has no administration in time range)  ondansetron (ZOFRAN) tablet 4 mg (has no administration in time range)    Or  ondansetron (ZOFRAN) injection 4 mg (has no administration in time range)  morphine 2 MG/ML injection 2 mg (2 mg Intravenous Given 11/13/18 2016)  0.9 %  sodium chloride infusion ( Intravenous New Bag/Given 11/13/18 2023)  morphine 2 MG/ML injection (  Canceled Entry 11/13/18 2016)  vancomycin (VANCOCIN) 500 mg in sodium chloride 0.9 % 100 mL IVPB (has no administration in time range)  vancomycin (VANCOCIN) IVPB 1000 mg/200 mL premix (0 mg Intravenous Stopped 11/13/18 1829)  cefTRIAXone (ROCEPHIN) 2 g in sodium chloride 0.9 % 100 mL IVPB (0 g  Intravenous Stopped 11/13/18 2023)  sodium chloride 0.9 % bolus 1,000 mL (0 mLs Intravenous Stopped 11/13/18 1855)  fentaNYL (SUBLIMAZE) injection 50 mcg (50 mcg Intravenous Given 11/13/18 1631)  iohexol (OMNIPAQUE) 300 MG/ML solution 75 mL (75 mLs Intravenous Contrast Given 11/13/18 1702)    Mobility walks Low fall risk   Focused Assessments    R Recommendations: See Admitting Provider Note  Report given to:   Additional Notes:

## 2018-11-13 NOTE — H&P (Signed)
History and Physical    Amanda Ray TFT:[id number] DOB: Feb 26, 1985 DOA: 11/13/2018  PCP: Patient, No Pcp Per  Patient coming from: Home  I have personally briefly reviewed patient's old medical records in [facility]  Chief Complaint: Facial redness and pain  HPI: Amanda Ray is a 33 y.o. female with medical history significant of asthma and polysubstance abuse who presented with severe right facial cellulitis.  Patient first noticed a small pimple to her right cheek about 2 days ago when swelling and redness progressively migrated up to her right eye.  Also notes she has been having issues with her upper right tooth that is chipped and has not seek medical care.  She endorses using IV heroin this morning prior to admission and her usual injection sites are on either side of her neck.  She denies any neck pain.  Denies any issues with swallowing or breathing.  She denies any vision changes or orbital pain.  She notes constant headache throughout her right side that is pressure-like.  She is nauseous but also hungry.    Patient endorsed half a pack tobacco use.  Last use IV heroin this morning and marijuana last night.  ED Course:   She had mildly elevated temperature of 99 and body hypertensive up to 145/1 60s and tachycardic up to 140s.  CBC had significant leukocytosis of 34.7 and a hemoglobin of 15.2.  CMP had mild hyponatremia 132 but otherwise stable creatinine and no other significant lab abnormalities.  Lactic acid of 2.1.  CT maxillofacial showed severe right maxillofacial area cellulitis extending to the right periorbital, right peri-mandible and right upper neck soft tissue.  No evidence of post septal orbital extension on the current examination.  2.8 cm subtle region of low attenuation with peripheral enhancement in the right cheek soft tissue which may represent early abscess formation.  Asymmetric prominence of the right parotid gland which may reflect parotiditis.  Right upper  cervical lymphadenopathy.  Review of Systems:  Constitutional: No Weight Change, No Fever ENT/Mouth: No sore throat, No Rhinorrhea Eyes: No Eye Pain, No Vision Changes Cardiovascular: No Chest Pain, no SOB Respiratory: No Cough, No Sputum, No Wheezing, no Dyspnea  Gastrointestinal: No Nausea, No Vomiting, No Diarrhea, No Constipation, No Pain Genitourinary: no Urinary Incontinence, No Urgency, No Flank Pain Musculoskeletal: No Arthralgias, No Myalgias Skin: +Skin Lesions, No Pruritus, Neuro: no Weakness, No Numbness,  No Loss of Consciousness, No Syncope Psych: No Anxiety/Panic, No Depression, no decrease appetite Heme/Lymph: No Bruising, No Bleeding   Past Medical History:  Diagnosis Date  . Abscess   . Asthma     History reviewed. No pertinent surgical history.   has no history on file for tobacco, alcohol, and drug.  No Known Allergies  No family history on file. Unacceptable: Noncontributory, unremarkable, or negative. Acceptable: Family history reviewed and not pertinent (If you reviewed it)  Prior to Admission medications   Not on File    Physical Exam: Vitals:   11/13/18 1632 11/13/18 1830 11/13/18 1900 11/13/18 1930  BP:  133/90 (!) 135/91 (!) 137/92  Pulse:  (!) 119 (!) 125 (!) 125  Resp:  (!) 24 13 14   Temp:      SpO2:  93% 100% 100%  Weight: 45.4 kg     Height: 5\' 5"  (1.651 m)       Constitutional: Young female laying in bed at about 45 degrees incline.   Vitals:   11/13/18 1632 11/13/18 1830 11/13/18 1900 11/13/18 1930  BP:  133/90 (!) 135/91 (!) 137/92  Pulse:  (!) 119 (!) 125 (!) 125  Resp:  (!) 24 13 14   Temp:      SpO2:  93% 100% 100%  Weight: 45.4 kg     Height: 5\' 5"  (1.651 m)      Eyes: PERRL, lids and conjunctivae normal, significant erythema and edema to the lower lids of the right eye with frequent tearing of the lateral lacrimal duct. ENMT: Mucous membranes are moist. Posterior pharynx clear of any exudate or lesions.right tonsil  visibly more edematous than left but airway still patent.  Poor dentition with chipped tooth on right upper jaw. Neck: normal, supple, no masses, no erythema Respiratory: clear to auscultation bilaterally, no wheezing, no crackles. Normal respiratory effort on room air. No accessory muscle use.  Cardiovascular: Regular rate and rhythm, no murmurs / rubs / gallops. No extremity edema. 2+ pedal pulses.  Abdomen: no tenderness, no masses palpated.  Bowel sounds positive.  Musculoskeletal: no clubbing / cyanosis. No joint deformity upper and lower extremities. Good ROM, no contractures. Normal muscle tone.  Skin: Right face had significant erythema from her lower mandible up to the lower lids of her right eye.  Moderate amount of edema to the lower lid of the right eye.  There is scattered erythematous papules throughout her right face with 1 small area of fluctuant near the lateral side of her right lips. Neurologic: CN 2-12 grossly intact. Sensation intact, DTR normal. Strength 5/5 in all 4.  Psychiatric: Normal judgment and insight. Alert and oriented x 3. Normal mood.     Labs on Admission: I have personally reviewed following labs and imaging studies  CBC: Recent Labs  Lab 11/13/18 1540  WBC 34.7*  NEUTROABS 28.9*  HGB 15.2*  HCT 46.5*  MCV 89.6  PLT 371   Basic Metabolic Panel: Recent Labs  Lab 11/13/18 1540  NA 132*  K 3.8  CL 94*  CO2 25  GLUCOSE 156*  BUN 7  CREATININE 0.55  CALCIUM 9.7   GFR: Estimated Creatinine Clearance: 73 mL/min (by C-G formula based on SCr of 0.55 mg/dL). Liver Function Tests: Recent Labs  Lab 11/13/18 1540  AST 23  ALT 13  ALKPHOS 104  BILITOT 0.6  PROT 9.0*  ALBUMIN 4.3   No results for input(s): LIPASE, AMYLASE in the last 168 hours. No results for input(s): AMMONIA in the last 168 hours. Coagulation Profile: No results for input(s): INR, PROTIME in the last 168 hours. Cardiac Enzymes: No results for input(s): CKTOTAL, CKMB,  CKMBINDEX, TROPONINI in the last 168 hours. BNP (last 3 results) No results for input(s): PROBNP in the last 8760 hours. HbA1C: No results for input(s): HGBA1C in the last 72 hours. CBG: No results for input(s): GLUCAP in the last 168 hours. Lipid Profile: No results for input(s): CHOL, HDL, LDLCALC, TRIG, CHOLHDL, LDLDIRECT in the last 72 hours. Thyroid Function Tests: No results for input(s): TSH, T4TOTAL, FREET4, T3FREE, THYROIDAB in the last 72 hours. Anemia Panel: No results for input(s): VITAMINB12, FOLATE, FERRITIN, TIBC, IRON, RETICCTPCT in the last 72 hours. Urine analysis: No results found for: COLORURINE, APPEARANCEUR, LABSPEC, [place], GLUCOSEU, [place], [place], KETONESUR, PROTEINUR, UROBILINOGEN, NITRITE, LEUKOCYTESUR  Radiological Exams on Admission: Ct Maxillofacial W Contrast  Result Date: 11/13/2018 CLINICAL DATA:  Cellulitis of face. Additional history provided: Patient reports temple to right side of face since yesterday, swelling started last night. EXAM: CT MAXILLOFACIAL WITH CONTRAST TECHNIQUE: Multidetector CT imaging of the maxillofacial structures was  performed with intravenous contrast. Multiplanar CT image reconstructions were also generated. CONTRAST:  75mL OMNIPAQUE IOHEXOL 300 MG/ML  SOLN COMPARISON:  No pertinent prior studies available for comparison. FINDINGS: Osseous: There are multiple carious teeth. Subtle periapical lucency surrounding the left lower second premolar and first molar. Orbits: Right periorbital cellulitis. No definite evidence of postseptal extension on the current exam. Sinuses: No significant paranasal sinus disease or mastoid effusion. Soft tissues: There is severe right maxillofacial soft tissue swelling and stranding consistent with cellulitis which extends to the right periorbital region. There is a subtle circumscribed low-attenuation region with peripheral enhancement in the right cheek soft tissues measuring 2.8 x 1.0 x 1.2 cm (AP x TV  x CC) (series 3, image 60) (series 7, image 30). This may reflect very early abscess formation and extends anteriorly toward the right cheek skin surface (series 3, image 65). Cellulitis changes also extend to the right perimandibular region and upper right neck. Asymmetric prominence of the right parotid gland may reflect a reactive parotiditis. No appreciable mass or swelling within the nasopharynx, oral cavity, oropharynx, hypopharynx or larynx. Enlarged right cervical chain lymph nodes, likely reactive. An index right level II lymph node measures 16 mm in short axis Limited intracranial: Unremarkable IMPRESSION: 1. Severe right maxillofacial cellulitis extending to the right periorbital, right perimandibular and right upper neck soft tissues. No evidence of postseptal orbital extension on the current examination. 2. 2.8 cm subtle region of low attenuation with peripheral enhancement in the right cheek soft tissues, as described and which may reflect very early abscess formation. 3. Asymmetric prominence of the right parotid gland, which may reflect reactive parotiditis. Clinical correlation is recommended. 4. Right upper cervical lymphadenopathy, likely reactive. Electronically Signed   By: Jackey LogeKyle  Golden   On: 11/13/2018 17:51    EKG: Independently reviewed.  Assessment/Plan  Sepsis secondary to right maxillofacial cellulitis - CT maxillofacial showed severe right maxillofacial area cellulitis extending to the right periorbital, right peri-mandible and right upper neck soft tissue. Possible 2.6 cm abscess formation.  - Stable respiratory status- able to maintain airway -Continue vancomycin and Rocephin for now and re-evaluate for any need of I&D of potential abscess  -Tylenol for mild pain, tramadol for moderate pain, morphine for severe pain -Continue to trend lactate -IV fluids  Asthma -stable.   Polysubstance abuse - Last IV heroine use in the AM prior to admission - MJ use last night     Amanda Sessler T Makala Fetterolf DO [facility]   If 7PM-7AM, please contact night-coverage [email or web address] Password [email or web address]  11/13/2018, 7:43 PM

## 2018-11-13 NOTE — Progress Notes (Signed)
Pharmacy Antibiotic Note  Amanda Ray is a 33 y.o. female admitted on 11/13/2018 with cellulitis.  Pharmacy has been consulted for vancomycin dosing.  Pt has PMH significant for IVDU and asthma. Pt presenting with right sided facial swelling and pain.   Today, 11/13/18  WBC 34.7 - elevate  SCr WNL, CrCl ~70 mL/min  TBW < IBW  Afebrile  Plan:  Vancomycin 1000 mg IV dose given in ED, followed by vancomycin 500 mg IV q12h  Goal vancomycin AUC 400-550  Follow renal function and culture data  Check vancomycin levels once at steady state if indicated  Height: 5\' 5"  (165.1 cm) Weight: 100 lb (45.4 kg) IBW/kg (Calculated) : 57  Temp (24hrs), Avg:99 F (37.2 C), Min:99 F (37.2 C), Max:99 F (37.2 C)  Recent Labs  Lab 11/13/18 1540 11/13/18 1905  WBC 34.7*  --   CREATININE 0.55  --   LATICACIDVEN 2.1* 1.4    Estimated Creatinine Clearance: 73 mL/min (by C-G formula based on SCr of 0.55 mg/dL).    No Known Allergies  Antimicrobials this admission: vancomycin 10/8 >>   Dose adjustments this admission:  Microbiology results: 10/8 BCx: Sent 10/8 UCx: Sent  10/8 SARS-2: Negative  Thank you for allowing pharmacy to be a part of this patient's care.  Lenis Noon, PharmD 11/13/2018 9:13 PM

## 2018-11-13 NOTE — ED Triage Notes (Signed)
Patient reports pimple to right side of face since yesterday. States swelling started last night. Hx abscess.

## 2018-11-14 DIAGNOSIS — E878 Other disorders of electrolyte and fluid balance, not elsewhere classified: Secondary | ICD-10-CM

## 2018-11-14 DIAGNOSIS — E871 Hypo-osmolality and hyponatremia: Secondary | ICD-10-CM

## 2018-11-14 DIAGNOSIS — E872 Acidosis: Secondary | ICD-10-CM

## 2018-11-14 DIAGNOSIS — D72829 Elevated white blood cell count, unspecified: Secondary | ICD-10-CM

## 2018-11-14 DIAGNOSIS — R000 Tachycardia, unspecified: Secondary | ICD-10-CM

## 2018-11-14 LAB — BASIC METABOLIC PANEL
Anion gap: 13 (ref 5–15)
BUN: 9 mg/dL (ref 6–20)
CO2: 25 mmol/L (ref 22–32)
Calcium: 9.3 mg/dL (ref 8.9–10.3)
Chloride: 96 mmol/L — ABNORMAL LOW (ref 98–111)
Creatinine, Ser: 0.5 mg/dL (ref 0.44–1.00)
GFR calc Af Amer: >60 mL/min (ref >60)
GFR calc non Af Amer: >60 mL/min (ref >60)
Glucose, Bld: 162 mg/dL — ABNORMAL HIGH (ref 70–99)
Potassium: 3.7 mmol/L (ref 3.5–5.1)
Sodium: 134 mmol/L — ABNORMAL LOW (ref 135–145)

## 2018-11-14 LAB — CBC
HCT: 42.4 % (ref 36.0–46.0)
Hemoglobin: 13.9 g/dL (ref 12.0–15.0)
MCH: 29.4 pg (ref 26.0–34.0)
MCHC: 32.8 g/dL (ref 30.0–36.0)
MCV: 89.8 fL (ref 80.0–100.0)
Platelets: 323 10*3/uL (ref 150–400)
RBC: 4.72 MIL/uL (ref 3.87–5.11)
RDW: 14.7 % (ref 11.5–15.5)
WBC: 27.7 10*3/uL — ABNORMAL HIGH (ref 4.0–10.5)
nRBC: 0 % (ref 0.0–0.2)

## 2018-11-14 LAB — LACTIC ACID, PLASMA: Lactic Acid, Venous: 2.2 mmol/L (ref 0.5–1.9)

## 2018-11-14 LAB — HIV ANTIBODY (ROUTINE TESTING W REFLEX): HIV Screen 4th Generation wRfx: NONREACTIVE

## 2018-11-14 MED ORDER — CLONIDINE HCL 0.1 MG PO TABS
0.1000 mg | ORAL_TABLET | Freq: Every day | ORAL | Status: DC
  Administered 2018-11-20: 09:00:00 0.1 mg via ORAL
  Filled 2018-11-14 (×2): qty 1

## 2018-11-14 MED ORDER — HYDROXYZINE HCL 25 MG PO TABS
25.0000 mg | ORAL_TABLET | Freq: Four times a day (QID) | ORAL | Status: AC | PRN
  Administered 2018-11-16: 25 mg via ORAL
  Filled 2018-11-14: qty 1

## 2018-11-14 MED ORDER — CLONIDINE HCL 0.1 MG PO TABS
0.1000 mg | ORAL_TABLET | Freq: Four times a day (QID) | ORAL | Status: AC
  Administered 2018-11-14 – 2018-11-16 (×9): 0.1 mg via ORAL
  Filled 2018-11-14 (×9): qty 1

## 2018-11-14 MED ORDER — NAPROXEN 500 MG PO TABS
500.0000 mg | ORAL_TABLET | Freq: Two times a day (BID) | ORAL | Status: AC | PRN
  Administered 2018-11-15: 500 mg via ORAL
  Filled 2018-11-14: qty 1

## 2018-11-14 MED ORDER — METHOCARBAMOL 500 MG PO TABS: 500.0000 mg | ORAL_TABLET | Freq: Three times a day (TID) | ORAL | Status: AC | PRN

## 2018-11-14 MED ORDER — CLONIDINE HCL 0.1 MG PO TABS
0.1000 mg | ORAL_TABLET | ORAL | Status: AC
  Administered 2018-11-17 – 2018-11-18 (×3): 0.1 mg via ORAL
  Filled 2018-11-14 (×4): qty 1

## 2018-11-14 MED ORDER — ONDANSETRON 4 MG PO TBDP: 4.0000 mg | ORAL_TABLET | Freq: Four times a day (QID) | ORAL | Status: AC | PRN

## 2018-11-14 MED ORDER — NICOTINE 14 MG/24HR TD PT24
14.0000 mg | MEDICATED_PATCH | Freq: Every day | TRANSDERMAL | Status: DC
  Administered 2018-11-14 – 2018-11-20 (×7): 14 mg via TRANSDERMAL
  Filled 2018-11-14 (×7): qty 1

## 2018-11-14 MED ORDER — SODIUM CHLORIDE 0.9 % IV BOLUS
500.0000 mL | Freq: Once | INTRAVENOUS | Status: AC
  Administered 2018-11-14: 500 mL via INTRAVENOUS

## 2018-11-14 MED ORDER — LOPERAMIDE HCL 2 MG PO CAPS: 2.0000 mg | ORAL_CAPSULE | ORAL | Status: AC | PRN

## 2018-11-14 MED ORDER — DICYCLOMINE HCL 20 MG PO TABS
20.0000 mg | ORAL_TABLET | Freq: Four times a day (QID) | ORAL | Status: AC | PRN
  Filled 2018-11-14: qty 1

## 2018-11-14 MED ORDER — SODIUM CHLORIDE 0.9 % IV SOLN
2.0000 g | INTRAVENOUS | Status: DC
  Administered 2018-11-14: 17:00:00 2 g via INTRAVENOUS
  Filled 2018-11-14: qty 2
  Filled 2018-11-14: qty 20

## 2018-11-14 NOTE — Consult Note (Signed)
CC:  Chief Complaint  Patient presents with  . Facial Swelling    HPI: Amanda Ray is a 33 y.o. female w/o significant POH who presents for facial swelling. Pimple started on right cheek that progressed to involving eyelid. No diplopia. No changes in vision.   ROS: As per HPI  PMH: Past Medical History:  Diagnosis Date  . Abscess   . Asthma     PSH: History reviewed. No pertinent surgical history.  Meds: No current facility-administered medications on file prior to encounter.    No current outpatient medications on file prior to encounter.    SH: Social History   Socioeconomic History  . Marital status: Single    Spouse name: Not on file  . Number of children: Not on file  . Years of education: Not on file  . Highest education level: Not on file  Occupational History  . Not on file  Social Needs  . Financial resource strain: Not on file  . Food insecurity    Worry: Not on file    Inability: Not on file  . Transportation needs    Medical: Not on file    Non-medical: Not on file  Tobacco Use  . Smoking status: Current Every Day Smoker  . Smokeless tobacco: Current User  Substance and Sexual Activity  . Alcohol use: Not on file  . Drug use: Not on file  . Sexual activity: Not on file  Lifestyle  . Physical activity    Days per week: Not on file    Minutes per session: Not on file  . Stress: Not on file  Relationships  . Social Musician on phone: Not on file    Gets together: Not on file    Attends religious service: Not on file    Active member of club or organization: Not on file    Attends meetings of clubs or organizations: Not on file    Relationship status: Not on file  Other Topics Concern  . Not on file  Social History Narrative  . Not on file    FH: History reviewed. No pertinent family history.  Exam:  Zenaida Niece: OD: 3 pt [place] OS: 3 pt [place]  CVF: OD: full OS: full  EOM: OD: full d/v OS: full d/v  Pupils: OD: 3->2 mm,  no APD OS: 3->2 mm, no APD  IOP: by [place] OD: 23 OS: 20  External: [id number] facial swelling worst over right cheek but involving upper and lower eyelid, no proptosis,  good orbicularis strength OS: no periorbital edema, no proptosis, good orbicularis strength  [place]:   Pen Light Exam: L/L: OD: edema and erythema OS: WNL  C/S: OD: 1+ injection, no chemosis OS: white and quiet  K: OD: clear, no abnormal staining OS: clear, no abnormal staining  A/C: OD: grossly deep and quiet appearing by pen light OS: grossly deep and quiet appearing by pen light  I: OD: round and regular OS: round and regular  L: OD: clear OS: clear  DFE: dilated @ 5:45 w/ [place] and Phenyl OU  V: OD: clear OS: clear  N: OD: C/D 0.2, no disc edema OS: C/D 0.2, no disc edema  M: OD: flat, no obvious macular pathology OS: flat, no obvious macular pathology  V: OD: normal appearing vessels OS: normal appearing vessels  P: OD: retina flat 360, no obvious mass/RT/RD OS: retina flat 360, no obvious mass/RT/RD   CT Scan: 1. Severe right maxillofacial cellulitis extending to  the right periorbital, right perimandibular and right upper neck soft tissues. No evidence of postseptal orbital extension on the current examination. 2. 2.8 cm subtle region of low attenuation with peripheral enhancement in the right cheek soft tissues, as described and which may reflect very early abscess formation. 3. Asymmetric prominence of the right parotid gland, which may reflect reactive parotiditis. Clinical correlation is recommended. 4. Right upper cervical lymphadenopathy, likely reactive.  A/P:  1. Facial Cellulitis w/ Periocular Involvement OD: - No evidence of post-septal involvement and no orbital signs - Agree w/ IV broad spectrum ABx and defer transition to PO to primary team or ENT/dental - Appears dental or ENT in origin w/ edema spreading into eyelids thus will sign off unless new  changes or worsening in vision or orbital signs such as chemosis, APD, diplopia, or drop in vision   T. Manuella Ghazi, [place] [phone]

## 2018-11-14 NOTE — Progress Notes (Signed)
Pt has been running tachy since she arrived.

## 2018-11-14 NOTE — Consult Note (Signed)
Reason for Consult: Right facial cellulitis Referring Physician: Merlene Laughter, DO  HPI:  Amanda Ray is an 33 y.o. female who was admitted yesterday for treatment of her right facial cellulitis. The patient first noted a small pimple to her right cheek about 2 days ago. The swelling and redness progressively migrated up to her right eye. Also notes she has been having issues with her upper right tooth that is chipped and has not seek medical care. 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 She denies any neck pain. Denies any issues with swallowing or breathing. She denies any vision changes or orbital pain. She notes constantheadache throughout her right side that ispressure-like. CT shows right facial cellulitis.  Past Medical History:  Diagnosis Date  . Abscess   . Asthma     History reviewed. No pertinent surgical history.  History reviewed. No pertinent family history.  Social History:  reports that she has been smoking. She uses smokeless tobacco. No history on file for alcohol and drug.  Allergies: No Known Allergies  Prior to Admission medications   Not on File    Medications:  I have reviewed the patient's current medications. Scheduled: . cloNIDine  0.1 mg Oral QID   Followed by  . [START ON 11/17/2018] cloNIDine  0.1 mg Oral BH-qamhs   Followed by  . [START ON 11/19/2018] cloNIDine  0.1 mg Oral QAC breakfast  . enoxaparin (LOVENOX) injection  40 mg Subcutaneous Q24H  . nicotine  14 mg Transdermal Daily   Continuous: . sodium chloride 100 mL/hr at 11/14/18 1131  . cefTRIAXone (ROCEPHIN)  IV 2 g (11/14/18 1705)  . vancomycin 500 mg (11/14/18 1808)    Results for orders placed or performed during the hospital encounter of 11/13/18 (from the past 48 hour(s))  Lactic acid, plasma     Status: Abnormal   Collection Time: 11/13/18  3:40 PM  Result Value Ref Range    Lactic Acid, Venous 2.1 (HH) 0.5 - 1.9 mmol/L    Comment: CRITICAL RESULT CALLED TO, READ BACK BY AND VERIFIED WITH: M.GRIFFIN AT 1630 ON 11/13/18 BY N.THOMPSON Performed at [facility], [address] [place], [place] [address]   Comprehensive metabolic panel     Status: Abnormal   Collection Time: 11/13/18  3:40 PM  Result Value Ref Range   Sodium 132 (L) 135 - 145 mmol/L   Potassium 3.8 3.5 - 5.1 mmol/L   Chloride 94 (L) 98 - 111 mmol/L   CO2 25 22 - 32 mmol/L   Glucose, Bld 156 (H) 70 - 99 mg/dL   BUN 7 6 - 20 mg/dL   Creatinine, Ser 4.78 0.44 - 1.00 mg/dL   Calcium 9.7 8.9 - 29.5 mg/dL   Total Protein 9.0 (H) 6.5 - 8.1 g/dL   Albumin 4.3 3.5 - 5.0 g/dL   AST 23 15 - 41 U/L   ALT 13 0 - 44 U/L   Alkaline Phosphatase 104 38 - 126 U/L   Total Bilirubin 0.6 0.3 - 1.2 mg/dL   GFR calc non Af Amer >60 >60 mL/min   GFR calc Af Amer >60 >60 mL/min   Anion gap 13 5 - 15    Comment: Performed at [facility], [address], [place], [place] [address]  CBC with Differential     Status: Abnormal   Collection Time: 11/13/18  3:40 PM  Result Value  Ref Range   WBC 34.7 (H) 4.0 - 10.5 K/uL   RBC 5.19 (H) 3.87 - 5.11 MIL/uL   Hemoglobin 15.2 (H) 12.0 - 15.0 g/dL   HCT 16.146.5 (H) 09.636.0 - 04.546.0 %   MCV 89.6 80.0 - 100.0 fL   MCH 29.3 26.0 - 34.0 pg   MCHC 32.7 30.0 - 36.0 g/dL   RDW 40.914.6 81.111.5 - 91.415.5 %   Platelets 343 150 - 400 K/uL   nRBC 0.0 0.0 - 0.2 %   Neutrophils Relative % 83 %   Neutro Abs 28.9 (H) 1.7 - 7.7 K/uL   Lymphocytes Relative 8 %   Lymphs Abs 2.7 0.7 - 4.0 K/uL   Monocytes Relative 8 %   Monocytes Absolute 2.7 (H) 0.1 - 1.0 K/uL   Eosinophils Relative 0 %   Eosinophils Absolute 0.0 0.0 - 0.5 K/uL   Basophils Relative 0 %   Basophils Absolute 0.1 0.0 - 0.1 K/uL   WBC Morphology TOXIC GRANULATION    Immature Granulocytes 1 %   Abs Immature Granulocytes 0.24 (H) 0.00 - 0.07 K/uL    Comment: Performed at [facility], [address] [place], [place] [address]  Urinalysis, Routine w reflex microscopic     Status: Abnormal   Collection Time: 11/13/18  3:40 PM  Result Value Ref Range   Color, Urine YELLOW YELLOW   APPearance CLEAR CLEAR   Specific Gravity, Urine 1.015 1.005 - 1.030   pH 7.0 5.0 - 8.0   Glucose, UA NEGATIVE NEGATIVE mg/dL   Hgb urine dipstick SMALL (A) NEGATIVE   Bilirubin Urine NEGATIVE NEGATIVE   Ketones, ur NEGATIVE NEGATIVE mg/dL   Protein, ur NEGATIVE NEGATIVE mg/dL   Nitrite NEGATIVE NEGATIVE   Leukocytes,Ua NEGATIVE NEGATIVE   RBC / HPF 0-5 0 - 5 RBC/hpf   WBC, UA 0-5 0 - 5 WBC/hpf   Bacteria, UA NONE SEEN NONE SEEN   Squamous Epithelial / LPF 0-5 0 - 5    Comment: Performed at [facility], [address] [place], [place] [address]  I-Stat beta hCG blood, ED     Status: None   Collection Time: 11/13/18  3:58 PM  Result Value Ref Range   I-stat hCG, quantitative <5.0 <5 mIU/mL   Comment 3            Comment:   GEST. AGE      CONC.  (mIU/mL)   <=1 WEEK        5 - 50     2 WEEKS       50 - 500     3 WEEKS       100 - 10,000     4 WEEKS     1,000 - 30,000        FEMALE AND NON-PREGNANT FEMALE:     LESS THAN 5 mIU/mL   Blood Culture (routine x 2)     Status: None (Preliminary result)   Collection Time: 11/13/18  4:08 PM   Specimen: BLOOD  Result Value Ref Range   Specimen Description      BLOOD LEFT ANTECUBITAL Performed at [facility], [address] [place], [place] [address]    Special Requests      BOTTLES DRAWN AEROBIC AND ANAEROBIC Blood Culture results may not be optimal due to an excessive volume of blood received in culture bottles Performed at [facility], [address] [place], [place] [address]  Culture      NO GROWTH < 24 HOURS Performed at [facility], [place] [address], [place], [place] [address]    Report Status PENDING   Urine culture     Status: Abnormal (Preliminary  result)   Collection Time: 11/13/18  4:11 PM   Specimen: In/Out Cath Urine  Result Value Ref Range   Specimen Description      IN/OUT CATH URINE Performed at [facility], [place] [address] [place], [place] [address]    Special Requests      NONE Performed at [facility], [place] [address] [place], [place] [address]    Culture (A)     >=100,000 COLONIES/mL ESCHERICHIA COLI CULTURE REINCUBATED FOR BETTER GROWTH Performed at [facility], [place] [address], [place], [place] [address]    Report Status PENDING   Blood Culture (routine x 2)     Status: None (Preliminary result)   Collection Time: 11/13/18  4:36 PM   Specimen: BLOOD RIGHT FOREARM  Result Value Ref Range   Specimen Description      BLOOD RIGHT FOREARM Performed at [facility], [place] [address], [place], [place] [address]    Special Requests      BOTTLES DRAWN AEROBIC ONLY Blood Culture results may not be optimal due to an inadequate volume of blood received in culture bottles Performed at [place] [address] [place], [place] [address]    Culture      NO GROWTH < 24 HOURS Performed at [place] [address], [place],  [address]    Report Status PENDING   SARS Coronavirus 2 by RT PCR (hospital order, performed in [facility] hospital lab) Nasopharyngeal Nasopharyngeal Swab     Status: None   Collection Time: 11/13/18  6:44 PM   Specimen: Nasopharyngeal Swab  Result Value Ref Range   SARS Coronavirus 2 NEGATIVE NEGATIVE    Comment: (NOTE) If result is NEGATIVE SARS-CoV-2 target nucleic acids are NOT DETECTED. The SARS-CoV-2 RNA is generally detectable in upper and lower  respiratory specimens during the acute phase of infection. The lowest  concentration of SARS-CoV-2 viral copies this assay can detect is 250  copies / mL. A negative result does not preclude SARS-CoV-2 infection  and should not be used as the sole basis for  treatment or other  patient management decisions.  A negative result may occur with  improper specimen collection / handling, submission of specimen other  than nasopharyngeal swab, presence of viral mutation(s) within the  areas targeted by this assay, and inadequate number of viral copies  (<250 copies / mL). A negative result must be combined with clinical  observations, patient history, and epidemiological information. If result is POSITIVE SARS-CoV-2 target nucleic acids are DETECTED. The SARS-CoV-2 RNA is generally detectable in upper and lower  respiratory specimens dur ing the acute phase of infection.  Positive  results are indicative of active infection with SARS-CoV-2.  Clinical  correlation with patient history and other diagnostic information is  necessary to determine patient infection status.  Positive results do  not rule out bacterial infection or co-infection with other viruses. If result is PRESUMPTIVE POSTIVE SARS-CoV-2 nucleic acids MAY BE PRESENT.   A presumptive positive result was obtained on the submitted specimen  and confirmed on repeat testing.  While 2019 novel coronavirus  (SARS-CoV-2) nucleic acids may be present in the submitted sample  additional confirmatory testing may be  necessary for epidemiological  and / or clinical management purposes  to differentiate between  SARS-CoV-2 and other Sarbecovirus currently known to infect humans.  If clinically indicated additional testing with an alternate test  methodology [phone]) is advised. The SARS-CoV-2 RNA is generally  detectable in upper and lower respiratory sp ecimens during the acute  phase of infection. The expected result is Negative. Fact Sheet for Patients:  BoilerBrush.com.cy Fact Sheet for Healthcare Providers: https://pope.com/ This test is not yet approved or cleared by the [place] FDA and has been authorized for detection and/or  diagnosis of SARS-CoV-2 by FDA under an Emergency Use Authorization (EUA).  This EUA will remain in effect (meaning this test can be used) for the duration of the COVID-19 declaration under Section 564(b)(1) of the Act, 21 U.S.C. section 360bbb-3(b)(1), unless the authorization is terminated or revoked sooner. Performed at [facility], [address] [place], [place] [address]   Lactic acid, plasma     Status: None   Collection Time: 11/13/18  7:05 PM  Result Value Ref Range   Lactic Acid, Venous 1.4 0.5 - 1.9 mmol/L    Comment: Performed at [facility], [address] [place], [place] [address]  HIV Antibody (routine testing w rflx)     Status: None   Collection Time: 11/13/18 10:33 PM  Result Value Ref Range   HIV Screen 4th Generation wRfx NON REACTIVE NON REACTIVE    Comment: Performed at [facility], [address], [place], [place] [address]  Lactic acid, plasma     Status: None   Collection Time: 11/13/18 10:33 PM  Result Value Ref Range   Lactic Acid, Venous 1.4 0.5 - 1.9 mmol/L    Comment: Performed at [facility], [address], [place], [place] [address]  Basic metabolic panel     Status: Abnormal   Collection Time: 11/14/18  1:47 AM  Result Value Ref Range   Sodium 134 (L) 135 - 145 mmol/L   Potassium 3.7 3.5 - 5.1 mmol/L   Chloride 96 (L) 98 - 111 mmol/L   CO2 25 22 - 32 mmol/L   Glucose, Bld 162 (H) 70 - 99 mg/dL   BUN 9 6 - 20 mg/dL   Creatinine, Ser 0.86 0.44 - 1.00 mg/dL   Calcium 9.3 8.9 - 57.8 mg/dL   GFR calc non Af Amer >60 >60 mL/min   GFR calc Af Amer >60 >60 mL/min   Anion gap 13 5 - 15    Comment: Performed at [facility], [address], [place], [place] [address]  CBC     Status: Abnormal   Collection Time: 11/14/18  1:47 AM  Result Value Ref Range   WBC 27.7 (H) 4.0 - 10.5 K/uL   RBC 4.72 3.87 - 5.11 MIL/uL   Hemoglobin 13.9 12.0 - 15.0 g/dL   HCT 95.2  84.1 - 32.4 %   MCV 89.8 80.0 - 100.0 fL   MCH 29.4 26.0 - 34.0 pg   MCHC 32.8 30.0 - 36.0 g/dL   RDW 40.1 02.7 - 25.3 %   Platelets 323 150 - 400 K/uL   nRBC 0.0 0.0 - 0.2 %    Comment: Performed at [facility], [address], [place], [place] [address]  Lactic acid, plasma     Status: Abnormal   Collection Time: 11/14/18  1:47 AM  Result Value Ref Range   Lactic Acid, Venous 2.2 (HH) 0.5 -  1.9 mmol/L    Comment: CRITICAL RESULT CALLED TO, READ BACK BY AND VERIFIED WITH: Marylynn Pearson, RACHELLE @ 0237 ON 11/14/2018 C VARNER Performed at [facility], [address], [place], [place] [address]     Ct Maxillofacial W Contrast  Result Date: 11/13/2018 CLINICAL DATA:  Cellulitis of face. Additional history provided: Patient reports temple to right side of face since yesterday, swelling started last night. EXAM: CT MAXILLOFACIAL WITH CONTRAST TECHNIQUE: Multidetector CT imaging of the maxillofacial structures was performed with intravenous contrast. Multiplanar CT image reconstructions were also generated. CONTRAST:  45mL OMNIPAQUE IOHEXOL 300 MG/ML  SOLN COMPARISON:  No pertinent prior studies available for comparison. FINDINGS: Osseous: There are multiple carious teeth. Subtle periapical lucency surrounding the left lower second premolar and first molar. Orbits: Right periorbital cellulitis. No definite evidence of postseptal extension on the current exam. Sinuses: No significant paranasal sinus disease or mastoid effusion. Soft tissues: There is severe right maxillofacial soft tissue swelling and stranding consistent with cellulitis which extends to the right periorbital region. There is a subtle circumscribed low-attenuation region with peripheral enhancement in the right cheek soft tissues measuring 2.8 x 1.0 x 1.2 cm (AP x TV x CC) (series 3, image 60) (series 7, image 30). This may reflect very early abscess formation and extends anteriorly toward the right  cheek skin surface (series 3, image 65). Cellulitis changes also extend to the right perimandibular region and upper right neck. Asymmetric prominence of the right parotid gland may reflect a reactive parotiditis. No appreciable mass or swelling within the nasopharynx, oral cavity, oropharynx, hypopharynx or larynx. Enlarged right cervical chain lymph nodes, likely reactive. An index right level II lymph node measures 16 mm in short axis Limited intracranial: Unremarkable IMPRESSION: 1. Severe right maxillofacial cellulitis extending to the right periorbital, right perimandibular and right upper neck soft tissues. No evidence of postseptal orbital extension on the current examination. 2. 2.8 cm subtle region of low attenuation with peripheral enhancement in the right cheek soft tissues, as described and which may reflect very early abscess formation. 3. Asymmetric prominence of the right parotid gland, which may reflect reactive parotiditis. Clinical correlation is recommended. 4. Right upper cervical lymphadenopathy, likely reactive. Electronically Signed   By: Jackey Loge   On: 11/13/2018 17:51   Review of Systems:  Constitutional: No Weight Change, No Fever ENT/Mouth: No sore throat, No Rhinorrhea Eyes: No Eye Pain, No Vision Changes Cardiovascular: No Chest Pain, no SOB Respiratory: No Cough, No Sputum, No Wheezing, no Dyspnea  Gastrointestinal: No Nausea, No Vomiting, No Diarrhea, No Constipation, No Pain Genitourinary: no Urinary Incontinence, No Urgency, No Flank Pain Musculoskeletal: No Arthralgias, No Myalgias Skin: +Skin Lesions, No Pruritus, Neuro: no Weakness, No Numbness,  No Loss of Consciousness, No Syncope Psych: No Anxiety/Panic, No Depression, no decrease appetite Heme/Lymph: No Bruising, No Bleeding  Blood pressure 133/87, pulse (!) 120, temperature 99.1 F (37.3 C), temperature source Oral, resp. rate 12, height 5\' 5"  (1.651 m), weight 45.4 kg, last menstrual period 10/30/2018,  SpO2 100 %. Physical Exam Vitals signs and nursing note reviewed.  General: She is not in acute distress. She is well-developed.  Head and face: Normocephalic and atraumatic.  Right face with significant swelling and erythema.  The swelling extends from the right mandibular region all the way up to the right preseptal region.  There is tenderness and induration to the right cheek.  No fluctuance or active drainage. There is anterior cervical and submental adenopathy present. Eyes: Pupils  are equal, round, reactive to light. Extraocular motion is intact.  Ears: Examination of the ears shows normal auricles and external auditory canals bilaterally. Both tympanic membranes are intact.  Nose: Nasal examination shows normal mucosa, septum, turbinates.  Mouth: Oral cavity examination shows no mucosal lacerations. No significant trismus is noted.  Multiple dental carries. Cardiovascular:  Regular rhythm. Tachycardia present.  Pulmonary:  Pulmonary effort is normal. No respiratory distress.  Skin:Skin is warm.  Neurological: She is alert and oriented. Psychiatric:  Behavior normal.   Assessment/Plan: Right facial cellulitis. No drainable abscess at this time. Agree with IV antibiotics. Will follow clinically. Daily CBC.  Rexton Greulich W Richie Vadala 11/14/2018, 6:16 PM

## 2018-11-14 NOTE — Progress Notes (Deleted)
Patient's HR is 125, MEWS is 2, she has been tachycardiac since she has been here, this is not an acute change, MEWS protocol started, will continue to monitor. Heloise Ochoa, RN

## 2018-11-14 NOTE — Progress Notes (Addendum)
CRITICAL VALUE ALERT  Critical Value: Lactic acid 2.2  Date & Time Notied:  11/14/2018  0238 am  Provider Notified: Beaulah Corin  Orders Received/Actions taken: 500 ml sodium chloride IV bolus stat received and will carry out.

## 2018-11-14 NOTE — Progress Notes (Signed)
PROGRESS NOTE    Amanda Ray  TTS:[id number] DOB: 1985-07-21 DOA: 11/13/2018 PCP: Patient, No Pcp Per  Brief Narrative:  HPI per Dr. Anselm Jungling on 11/13/2018 Amanda Ray is a 33 y.o. female with medical history significant of asthma and polysubstance abuse who presented with severe right facial cellulitis.  Patient first noticed a small pimple to her right cheek about 2 days ago when swelling and redness progressively migrated up to her right eye.  Also notes she has been having issues with her upper right tooth that is chipped and has not seek medical care.  She endorses using IV heroin this morning prior to admission and her usual injection sites are on either side of her neck.  She denies any neck pain.  Denies any issues with swallowing or breathing.  She denies any vision changes or orbital pain.  She notes constant headache throughout her right side that is pressure-like.  She is nauseous but also hungry.    Patient endorsed half a pack tobacco use.  Last use IV heroin this morning and marijuana last night.  ED Course:   She had mildly elevated temperature of 99 and body hypertensive up to 145/1 60s and tachycardic up to 140s.  CBC had significant leukocytosis of 34.7 and a hemoglobin of 15.2.  CMP had mild hyponatremia 132 but otherwise stable creatinine and no other significant lab abnormalities.  Lactic acid of 2.1.  CT maxillofacial showed severe right maxillofacial area cellulitis extending to the right periorbital, right peri-mandible and right upper neck soft tissue.  No evidence of post septal orbital extension on the current examination.  2.8 cm subtle region of low attenuation with peripheral enhancement in the right cheek soft tissue which may represent early abscess formation.  Asymmetric prominence of the right parotid gland which may reflect parotiditis.  Right upper cervical lymphadenopathy.  **Interim History Ophthalmology, ENT and dentistry were consulted for further  evaluation.  Patient continues to have some nausea.  White blood cell count is trending down and sodium is slightly improving.  Assessment & Plan:   Active Problems:   Facial cellulitis  Sepsis Secondary to Right Maxillofacial Cellulitis with Periorbital involvement and ? Right Parotiditis  - CT maxillofacial showed severe right maxillofacial area cellulitis extending to the right periorbital, right peri-mandible and right upper neck soft tissue.  -CT also mentioned "There are multiple carious teeth. Subtle periapical lucency surrounding the left lower second premolar and first molar." -Possible 2.6 cm abscess formation.  -Stable respiratory status- able to maintain airway -Continue vancomycin and Rocephin for now and re-evaluate for any need of I&D of potential abscess; I have asked ENT Dr. Suszanne Conners to evaluate and as well as Ophthalmology Dr. Sherryll Burger -Dentistry to be consulted as well and I ordered Orthopantogram but unfortunately not done at [facility] now -C/w Tylenol for mild pain, tramadol for moderate pain, IV Morphine for severe pain -C/w Supportive Care and Antiemetics  -Continue to trend lactate and slightly worsened and went from 1.4 -> 2.2  -WBC went from 34.7 -> 27.7 -C/w IV fluids with NS at 100 mL/hr -Continues to have Sinus Tachycardia  -Check PCT Level in the AM  Lactic Acidosis -LA went from 1.4 -> 2.2 -C/w IVF Hydration  Erythrocytosis -Likely Hemoconcentrated on admission and now Hgb/Hct dropped from 15.2/46.2 -> 13.9/42.4 -Continue to Monitor and Trend -Repeat CBC in AM   Asthma -Stable -If necessary will need to place on Albuterol Neb prn  Polysubstance Abuse -Last IV heroin use in the  AM prior to admission -Marijuana use last night -Counseling given; Will place Social Work consult to provide resources  Tobacco Abuse -Smoking Cessation Counseling  -Started Nicotine 14 mg TD patch  Hyponatremia/Hypochloremia -Improving slightly  -Patient's Na+ went from 132  -> 134 and Chloride went from 94 -> 96 -Continue IVF as Above -Repeat CMP in AM  Hyperglycemia -Likely reactive in the setting of Infection -Glucose was 156 on admission and repeat was 162 this AM -Check HgbA1c -Continue to Monitor Blood Sugars carefully and if necessary will add Sensitive Novolog SSI  DVT prophylaxis: Enoxaparin 40 mg sq q24h Code Status: FULL CODE Family Communication: No family present at bedside  Disposition Plan: (specify when and where you expect patient to be discharged). Include barriers to DC in this tab.  Consultants:   ENT Dr. Suszanne Conners  Opthamololgy Dr. Sherryll Burger  Dentistry Dr. Kristin Bruins   Procedures: None   Antimicrobials: Anti-infectives (From admission, onward)   Start     Dose/Rate Route Frequency Ordered Stop   11/14/18 1800  cefTRIAXone (ROCEPHIN) 2 g in sodium chloride 0.9 % 100 mL IVPB     2 g 200 mL/hr over 30 Minutes Intravenous Every 24 hours 11/14/18 0013     11/14/18 0600  vancomycin (VANCOCIN) 500 mg in sodium chloride 0.9 % 100 mL IVPB     500 mg 100 mL/hr over 60 Minutes Intravenous Every 12 hours 11/13/18 2112     11/13/18 1600  vancomycin (VANCOCIN) IVPB 1000 mg/200 mL premix     1,000 mg 200 mL/hr over 60 Minutes Intravenous  Once 11/13/18 1557 11/13/18 1829   11/13/18 1600  cefTRIAXone (ROCEPHIN) 2 g in sodium chloride 0.9 % 100 mL IVPB     2 g 200 mL/hr over 30 Minutes Intravenous  Once 11/13/18 1557 11/13/18 2023     Subjective: Seen and examined at bedside and states that she still remains somewhat nauseous.  States that she "----ed up her face".  No chest pain, lightheadedness or dizziness.  States that she had no trouble breathing but got concerned when her lips started swelling.  No other concerns requested this time.  Objective: Vitals:   11/13/18 2030 11/13/18 2214 11/14/18 0525 11/14/18 1000  BP: (!) 115/56 127/86 128/87 129/82  Pulse: (!) 127 (!) 121 (!) 108 (!) 120  Resp: (!) Temp:  98.4 F (36.9 C) 99  F (37.2 C) 99.4 F (37.4 C)  TempSrc:  Oral Oral Oral  SpO2: 100% 100% 100% 99%  Weight:      Height:        Intake/Output Summary (Last 24 hours) at 11/14/2018 1313 Last data filed at 11/14/2018 1000 Gross per 24 hour  Intake 3417.08 ml  Output 1125 ml  Net 2292.08 ml   Filed Weights   11/13/18 1632  Weight: 45.4 kg   Examination: Physical Exam:  Constitutional: WN/WD thin Caucasian female currently in NAD and appears uncomfortable Eyes: Has a cellulitic Right Orbit ENMT: External Ears, Right Side of the face extremely swollen and Grossly normal hearing.  Neck: Swollen on the Right side  Respiratory: Mildly diminished to auscultation bilaterally, no wheezing, rales, rhonchi or crackles. Normal respiratory effort and patient is not tachypenic. No accessory muscle use.  Cardiovascular: Tachycardic Rate but regular rhythm, no murmurs / rubs / gallops. S1 and S2 auscultated. No extremity edema. 2+ pedal pulses. No carotid bruits.  Abdomen: Soft, non-tender, non-distended. Bowel sounds positive x4.  GU: Deferred. Musculoskeletal: No clubbing / cyanosis of  digits/nails. Normal strength and muscle tone.  Skin: Right side of face is cellulitic and warm to touch with swelling and also has some crusting on her right side of her chin.  Neurologic: CN 2-12 grossly intact with no focal deficits. Romberg sign and cerebellar reflexes not assessed.  Psychiatric: Normal judgment and insight. Alert and oriented x 3. Anxious mood and appropriate affect.   Data Reviewed: I have personally reviewed following labs and imaging studies  CBC: Recent Labs  Lab 11/13/18 1540 11/14/18 0147  WBC 34.7* 27.7*  NEUTROABS 28.9*  --   HGB 15.2* 13.9  HCT 46.5* 42.4  MCV 89.6 89.8  PLT 343 323   Basic Metabolic Panel: Recent Labs  Lab 11/13/18 1540 11/14/18 0147  NA 132* 134*  K 3.8 3.7  CL 94* 96*  CO2 25 25  GLUCOSE 156* 162*  BUN 7 9  CREATININE 0.55 0.50  CALCIUM 9.7 9.3    GFR: Estimated Creatinine Clearance: 73 mL/min (by C-G formula based on SCr of 0.5 mg/dL). Liver Function Tests: Recent Labs  Lab 11/13/18 1540  AST 23  ALT 13  ALKPHOS 104  BILITOT 0.6  PROT 9.0*  ALBUMIN 4.3   No results for input(s): LIPASE, AMYLASE in the last 168 hours. No results for input(s): AMMONIA in the last 168 hours. Coagulation Profile: No results for input(s): INR, PROTIME in the last 168 hours. Cardiac Enzymes: No results for input(s): CKTOTAL, CKMB, CKMBINDEX, TROPONINI in the last 168 hours. BNP (last 3 results) No results for input(s): PROBNP in the last 8760 hours. HbA1C: No results for input(s): HGBA1C in the last 72 hours. CBG: No results for input(s): GLUCAP in the last 168 hours. Lipid Profile: No results for input(s): CHOL, HDL, LDLCALC, TRIG, CHOLHDL, LDLDIRECT in the last 72 hours. Thyroid Function Tests: No results for input(s): TSH, T4TOTAL, FREET4, T3FREE, THYROIDAB in the last 72 hours. Anemia Panel: No results for input(s): VITAMINB12, FOLATE, FERRITIN, TIBC, IRON, RETICCTPCT in the last 72 hours. Sepsis Labs: Recent Labs  Lab 11/13/18 1540 11/13/18 1905 11/13/18 2233 11/14/18 0147  LATICACIDVEN 2.1* 1.4 1.4 2.2*    Recent Results (from the past 240 hour(s))  Blood Culture (routine x 2)     Status: None (Preliminary result)   Collection Time: 11/13/18  4:08 PM   Specimen: BLOOD  Result Value Ref Range Status   Specimen Description   Final    BLOOD LEFT ANTECUBITAL Performed at [facility], [address] [place], [place] [address]    Special Requests   Final    BOTTLES DRAWN AEROBIC AND ANAEROBIC Blood Culture results may not be optimal due to an excessive volume of blood received in culture bottles Performed at [facility], [address] [place], [place] [address]    Culture   Final    NO GROWTH < 24 HOURS Performed at [facility], [address] [place], [place] [address]     Report Status PENDING  Incomplete  Urine culture     Status: Abnormal (Preliminary result)   Collection Time: 11/13/18  4:11 PM   Specimen: In/Out Cath Urine  Result Value Ref Range Status   Specimen Description   Final    IN/OUT CATH URINE Performed at [facility], [address] [place], [place] [address]    Special Requests   Final    NONE Performed at [facility], [address] [place], [place] [address]    Culture (  A)  Final    >=100,000 COLONIES/mL GRAM NEGATIVE RODS CULTURE REINCUBATED FOR BETTER GROWTH Performed at [facility], [place] [address] [place], [place] [address]    Report Status PENDING  Incomplete  Blood Culture (routine x 2)     Status: None (Preliminary result)   Collection Time: 11/13/18  4:36 PM   Specimen: BLOOD RIGHT FOREARM  Result Value Ref Range Status   Specimen Description   Final    BLOOD RIGHT FOREARM Performed at [facility], [place] [address], [place], [place] [address]    Special Requests   Final    BOTTLES DRAWN AEROBIC ONLY Blood Culture results may not be optimal due to an inadequate volume of blood received in culture bottles Performed at [place] [address] [place], [place] [address]    Culture   Final    NO GROWTH < 24 HOURS Performed at [place] [address], [place], [place] [address]    Report Status PENDING  Incomplete  SARS Coronavirus 2 by RT PCR (hospital order, performed in [facility] hospital lab) Nasopharyngeal Nasopharyngeal Swab     Status: None   Collection Time: 11/13/18  6:44 PM   Specimen: Nasopharyngeal Swab  Result Value Ref Range Status   SARS Coronavirus 2 NEGATIVE NEGATIVE Final    Comment: (NOTE) If result is NEGATIVE SARS-CoV-2 target nucleic acids are NOT DETECTED. The SARS-CoV-2 RNA is generally detectable in upper and lower  respiratory specimens during the acute phase of infection. The lowest  concentration  of SARS-CoV-2 viral copies this assay can detect is 250  copies / mL. A negative result does not preclude SARS-CoV-2 infection  and should not be used as the sole basis for treatment or other  patient management decisions.  A negative result may occur with  improper specimen collection / handling, submission of specimen other  than nasopharyngeal swab, presence of viral mutation(s) within the  areas targeted by this assay, and inadequate number of viral copies  (<250 copies / mL). A negative result must be combined with clinical  observations, patient history, and epidemiological information. If result is POSITIVE SARS-CoV-2 target nucleic acids are DETECTED. The SARS-CoV-2 RNA is generally detectable in upper and lower  respiratory specimens dur ing the acute phase of infection.  Positive  results are indicative of active infection with SARS-CoV-2.  Clinical  correlation with patient history and other diagnostic information is  necessary to determine patient infection status.  Positive results do  not rule out bacterial infection or co-infection with other viruses. If result is PRESUMPTIVE POSTIVE SARS-CoV-2 nucleic acids MAY BE PRESENT.   A presumptive positive result was obtained on the submitted specimen  and confirmed on repeat testing.  While 2019 novel coronavirus  (SARS-CoV-2) nucleic acids may be present in the submitted sample  additional confirmatory testing may be necessary for epidemiological  and / or clinical management purposes  to differentiate between  SARS-CoV-2 and other Sarbecovirus currently known to infect humans.  If clinically indicated additional testing with an alternate test  methodology [phone]) is advised. The SARS-CoV-2 RNA is generally  detectable in upper and lower respiratory sp ecimens during the acute  phase of infection. The expected result is Negative. Fact Sheet for Patients:  StrictlyIdeas.no Fact Sheet for Healthcare  Providers: BankingDealers.co.za This test is not yet approved or cleared by the [place] FDA and has been authorized for detection and/or diagnosis of SARS-CoV-2 by FDA under an  Emergency Use Authorization (EUA).  This EUA will remain in effect (meaning this test can be used) for the duration of the COVID-19 declaration under Section 564(b)(1) of the Act, 21 U.S.C. section 360bbb-3(b)(1), unless the authorization is terminated or revoked sooner. Performed at [facility], [address], [place], [place] [address]     Radiology Studies: Ct Maxillofacial W Contrast  Result Date: 11/13/2018 CLINICAL DATA:  Cellulitis of face. Additional history provided: Patient reports temple to right side of face since yesterday, swelling started last night. EXAM: CT MAXILLOFACIAL WITH CONTRAST TECHNIQUE: Multidetector CT imaging of the maxillofacial structures was performed with intravenous contrast. Multiplanar CT image reconstructions were also generated. CONTRAST:  75mL OMNIPAQUE IOHEXOL 300 MG/ML  SOLN COMPARISON:  No pertinent prior studies available for comparison. FINDINGS: Osseous: There are multiple carious teeth. Subtle periapical lucency surrounding the left lower second premolar and first molar. Orbits: Right periorbital cellulitis. No definite evidence of postseptal extension on the current exam. Sinuses: No significant paranasal sinus disease or mastoid effusion. Soft tissues: There is severe right maxillofacial soft tissue swelling and stranding consistent with cellulitis which extends to the right periorbital region. There is a subtle circumscribed low-attenuation region with peripheral enhancement in the right cheek soft tissues measuring 2.8 x 1.0 x 1.2 cm (AP x TV x CC) (series 3, image 60) (series 7, image 30). This may reflect very early abscess formation and extends anteriorly toward the right cheek skin surface (series 3, image 65). Cellulitis  changes also extend to the right perimandibular region and upper right neck. Asymmetric prominence of the right parotid gland may reflect a reactive parotiditis. No appreciable mass or swelling within the nasopharynx, oral cavity, oropharynx, hypopharynx or larynx. Enlarged right cervical chain lymph nodes, likely reactive. An index right level II lymph node measures 16 mm in short axis Limited intracranial: Unremarkable IMPRESSION: 1. Severe right maxillofacial cellulitis extending to the right periorbital, right perimandibular and right upper neck soft tissues. No evidence of postseptal orbital extension on the current examination. 2. 2.8 cm subtle region of low attenuation with peripheral enhancement in the right cheek soft tissues, as described and which may reflect very early abscess formation. 3. Asymmetric prominence of the right parotid gland, which may reflect reactive parotiditis. Clinical correlation is recommended. 4. Right upper cervical lymphadenopathy, likely reactive. Electronically Signed   By: Jackey Loge   On: 11/13/2018 17:51   Scheduled Meds:  enoxaparin (LOVENOX) injection  40 mg Subcutaneous Q24H   nicotine  14 mg Transdermal Daily   Continuous Infusions:  sodium chloride 100 mL/hr at 11/14/18 1131   cefTRIAXone (ROCEPHIN)  IV     vancomycin 500 mg (11/14/18 0522)    LOS: 1 day   Merlene Laughter, DO [facility] PAGER is on AMION  If 7PM-7AM, please contact night-coverage [email or web address] Password [email or web address] 11/14/2018, 1:13 PM

## 2018-11-15 ENCOUNTER — Encounter (HOSPITAL_COMMUNITY): Payer: Self-pay | Admitting: *Deleted

## 2018-11-15 ENCOUNTER — Inpatient Hospital Stay (HOSPITAL_COMMUNITY): Payer: Medicaid - Out of State

## 2018-11-15 DIAGNOSIS — R7881 Bacteremia: Secondary | ICD-10-CM

## 2018-11-15 DIAGNOSIS — B9562 Methicillin resistant Staphylococcus aureus infection as the cause of diseases classified elsewhere: Secondary | ICD-10-CM

## 2018-11-15 DIAGNOSIS — L03211 Cellulitis of face: Secondary | ICD-10-CM

## 2018-11-15 DIAGNOSIS — F17200 Nicotine dependence, unspecified, uncomplicated: Secondary | ICD-10-CM

## 2018-11-15 DIAGNOSIS — F1990 Other psychoactive substance use, unspecified, uncomplicated: Secondary | ICD-10-CM

## 2018-11-15 LAB — COMPREHENSIVE METABOLIC PANEL
ALT: 23 U/L (ref 0–44)
AST: 30 U/L (ref 15–41)
Albumin: 3.3 g/dL — ABNORMAL LOW (ref 3.5–5.0)
Alkaline Phosphatase: 97 U/L (ref 38–126)
Anion gap: 10 (ref 5–15)
BUN: 5 mg/dL — ABNORMAL LOW (ref 6–20)
CO2: 22 mmol/L (ref 22–32)
Calcium: 9 mg/dL (ref 8.9–10.3)
Chloride: 105 mmol/L (ref 98–111)
Creatinine, Ser: 0.5 mg/dL (ref 0.44–1.00)
GFR calc Af Amer: >60 mL/min (ref >60)
GFR calc non Af Amer: >60 mL/min (ref >60)
Glucose, Bld: 152 mg/dL — ABNORMAL HIGH (ref 70–99)
Potassium: 4.2 mmol/L (ref 3.5–5.1)
Sodium: 137 mmol/L (ref 135–145)
Total Bilirubin: 0.5 mg/dL (ref 0.3–1.2)
Total Protein: 7.4 g/dL (ref 6.5–8.1)

## 2018-11-15 LAB — CBC WITH DIFFERENTIAL/PLATELET
Abs Immature Granulocytes: 0.15 10*3/uL — ABNORMAL HIGH (ref 0.00–0.07)
Basophils Absolute: 0.1 10*3/uL (ref 0.0–0.1)
Basophils Relative: 1 %
Eosinophils Absolute: 0.2 10*3/uL (ref 0.0–0.5)
Eosinophils Relative: 1 %
HCT: 37.5 % (ref 36.0–46.0)
Hemoglobin: 12.2 g/dL (ref 12.0–15.0)
Immature Granulocytes: 1 %
Lymphocytes Relative: 16 %
Lymphs Abs: 3.4 10*3/uL (ref 0.7–4.0)
MCH: 29.4 pg (ref 26.0–34.0)
MCHC: 32.5 g/dL (ref 30.0–36.0)
MCV: 90.4 fL (ref 80.0–100.0)
Monocytes Absolute: 1.6 10*3/uL — ABNORMAL HIGH (ref 0.1–1.0)
Monocytes Relative: 8 %
Neutro Abs: 15.2 10*3/uL — ABNORMAL HIGH (ref 1.7–7.7)
Neutrophils Relative %: 73 %
Platelets: 306 10*3/uL (ref 150–400)
RBC: 4.15 MIL/uL (ref 3.87–5.11)
RDW: 14.8 % (ref 11.5–15.5)
WBC: 20.7 10*3/uL — ABNORMAL HIGH (ref 4.0–10.5)
nRBC: 0 % (ref 0.0–0.2)

## 2018-11-15 LAB — URINE CULTURE: Culture: >=100000 — AB

## 2018-11-15 LAB — BLOOD CULTURE ID PANEL (REFLEXED)

## 2018-11-15 LAB — MAGNESIUM: Magnesium: 2.3 mg/dL (ref 1.7–2.4)

## 2018-11-15 LAB — PHOSPHORUS: Phosphorus: 2.5 mg/dL (ref 2.5–4.6)

## 2018-11-15 LAB — ECHOCARDIOGRAM COMPLETE
Height: 65 in
Weight: 1600 oz

## 2018-11-15 LAB — PROCALCITONIN: Procalcitonin: 0.15 ng/mL

## 2018-11-15 MED ORDER — CHLORHEXIDINE GLUCONATE CLOTH 2 % EX PADS: 6.0000 | MEDICATED_PAD | Freq: Once | CUTANEOUS | Status: DC

## 2018-11-15 MED ORDER — CHLORHEXIDINE GLUCONATE CLOTH 2 % EX PADS
6.0000 | MEDICATED_PAD | Freq: Once | CUTANEOUS | Status: AC
  Administered 2018-11-15: 22:00:00 6 via TOPICAL

## 2018-11-15 NOTE — Consult Note (Signed)
[facility]       Reason for Consult: MRSA bacteremia    Referring Physician: CHAMP autoconsult  Active Problems:   Facial cellulitis    cloNIDine  0.1 mg Oral QID   Followed by   Derrill Memo ON 11/17/2018] cloNIDine  0.1 mg Oral BH-qamhs   Followed by   Derrill Memo ON 11/19/2018] cloNIDine  0.1 mg Oral QAC breakfast   enoxaparin (LOVENOX) injection  40 mg Subcutaneous Q24H   nicotine  14 mg Transdermal Daily    Recommendations: Vancomycin Stop ceftriaxone TTE, likely TEE  Repeat blood cultures  Assessment: She uses IV drugs and now with blood culture with MRSA in the setting of her facial cellulitis.  Though only one bottle positive, still higher risk of endocarditis with her injection drug use.    Antibiotics: Vancomycin and ceftriaxone  HPI: Amanda Ray is a 33 y.o. 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  Extends up to eye but no difficulty with vision.  Has had fever and chills prior to admission.  WBC initially 27.7.  Feels better since admission.  No associated n/v/d.  No rash.  No neck pain, has had a headache with hit.  No issues swallowing.  CT with facial cellulitis with no drainable area.     Review of Systems:  Constitutional: positive for fevers and chills or negative for anorexia Respiratory: negative for cough or sputum Gastrointestinal: negative for diarrhea Integument/breast: negative for rash All other systems reviewed and are negative    Past Medical History:  Diagnosis Date   Abscess    Asthma     Social History   Tobacco Use   Smoking status: Current Every Day Smoker   Smokeless tobacco: Current User  Substance Use Topics   Alcohol use: Not on file   Drug use: Not on file    History reviewed. No pertinent family history.  No Known Allergies  Physical Exam: Constitutional: in no apparent distress  Vitals:   11/14/18 2043 11/15/18 0524  BP:   120/77  Pulse: (!) 103 95  Resp:  16  Temp:  98.2 F (36.8 C)  SpO2:  100%   EYES: anicteric ENMT: face on right with erythema up to eye, warm, swelling Cardiovascular: Cor RRR Respiratory: CTA B; normal respiratory effort GI: Bowel sounds are normal, liver is not enlarged, spleen is not enlarged Musculoskeletal: no pedal edema noted Skin: negatives: no rash Neuro: non focal; EOM intact  Lab Results  Component Value Date   WBC 20.7 (H) 11/15/2018   HGB 12.2 11/15/2018   HCT 37.5 11/15/2018   MCV 90.4 11/15/2018   PLT 306 11/15/2018    Lab Results  Component Value Date   CREATININE 0.50 11/15/2018   BUN 5 (L) 11/15/2018   NA 137 11/15/2018   K 4.2 11/15/2018   CL 105 11/15/2018   CO2 22 11/15/2018    Lab Results  Component Value Date   ALT 23 11/15/2018   AST 30 11/15/2018   ALKPHOS 97 11/15/2018     Microbiology: Recent Results (from the past 240 hour(s))  Blood Culture (routine x 2)     Status: None (Preliminary result)   Collection Time: 11/13/18  4:08 PM   Specimen: BLOOD  Result Value Ref Range Status   Specimen Description   Final    BLOOD LEFT ANTECUBITAL Performed at [facility], [place] [address] [place], [place] [address]  Special Requests   Final    BOTTLES DRAWN AEROBIC AND ANAEROBIC Blood Culture results may not be optimal due to an excessive volume of blood received in culture bottles Performed at [facility], [address] [place], [place] [address]    Culture  Setup Time   Final    GRAM POSITIVE COCCI IN CLUSTERS ANAEROBIC BOTTLE ONLY Organism ID to follow CRITICAL RESULT CALLED TO, READ BACK BY AND VERIFIED WITHTrixie Deis: J. GRIMSLEY PHARMD, AT [phone] 11/15/18 BY D.VANHOOK Performed at [facility], [address] [place], [place] [address]    Culture GRAM POSITIVE COCCI  Final   Report Status PENDING  Incomplete  Blood Culture ID Panel (Reflexed)     Status: Abnormal   Collection Time: 11/13/18   4:08 PM  Result Value Ref Range Status   Enterococcus species NOT DETECTED NOT DETECTED Final   Listeria monocytogenes NOT DETECTED NOT DETECTED Final   Staphylococcus species DETECTED (A) NOT DETECTED Final    Comment: CRITICAL RESULT CALLED TO, READ BACK BY AND VERIFIED WITH: Peggyann JubaJ. GRIMSLEY PHARMD, AT [phone] 11/15/18 BY D. VANHOOK    Staphylococcus aureus (BCID) DETECTED (A) NOT DETECTED Final    Comment: Methicillin (oxacillin)-resistant Staphylococcus aureus (MRSA). MRSA is predictably resistant to beta-lactam antibiotics (except ceftaroline). Preferred therapy is vancomycin unless clinically contraindicated. Patient requires contact precautions if  hospitalized. CRITICAL RESULT CALLED TO, READ BACK BY AND VERIFIED WITH: Peggyann JubaJ. GRIMSLEY PHARMD, AT [phone] 11/15/18 BY D. VANHOOK    Methicillin resistance DETECTED (A) NOT DETECTED Final    Comment: CRITICAL RESULT CALLED TO, READ BACK BY AND VERIFIED WITH: Peggyann JubaJ. GRIMSLEY PHARMD, AT [phone] 11/15/18 BY D. VANHOOK    Streptococcus species NOT DETECTED NOT DETECTED Final   Streptococcus agalactiae NOT DETECTED NOT DETECTED Final   Streptococcus pneumoniae NOT DETECTED NOT DETECTED Final   Streptococcus pyogenes NOT DETECTED NOT DETECTED Final   Acinetobacter baumannii NOT DETECTED NOT DETECTED Final   Enterobacteriaceae species NOT DETECTED NOT DETECTED Final   Enterobacter cloacae complex NOT DETECTED NOT DETECTED Final   Escherichia coli NOT DETECTED NOT DETECTED Final   Klebsiella oxytoca NOT DETECTED NOT DETECTED Final   Klebsiella pneumoniae NOT DETECTED NOT DETECTED Final   Proteus species NOT DETECTED NOT DETECTED Final   Serratia marcescens NOT DETECTED NOT DETECTED Final   Haemophilus influenzae NOT DETECTED NOT DETECTED Final   Neisseria meningitidis NOT DETECTED NOT DETECTED Final   Pseudomonas aeruginosa NOT DETECTED NOT DETECTED Final   Candida albicans NOT DETECTED NOT DETECTED Final   Candida glabrata NOT DETECTED NOT DETECTED Final    Candida krusei NOT DETECTED NOT DETECTED Final   Candida parapsilosis NOT DETECTED NOT DETECTED Final   Candida tropicalis NOT DETECTED NOT DETECTED Final    Comment: Performed at [facility], [address] [place], [place] [address]  Urine culture     Status: Abnormal   Collection Time: 11/13/18  4:11 PM   Specimen: In/Out Cath Urine  Result Value Ref Range Status   Specimen Description   Final    IN/OUT CATH URINE Performed at [facility], [address] [place], [place] [address]    Special Requests   Final    NONE Performed at [facility], [address] [place], [place] [address]    Culture (A)  Final    >=100,000 COLONIES/mL ESCHERICHIA COLI Confirmed Extended Spectrum Beta-Lactamase Producer (ESBL).  In bloodstream infections from ESBL organisms, carbapenems are preferred over piperacillin/tazobactam.  They are shown to have a lower risk of mortality. 10,000 COLONIES/mL GROUP B STREP(S.AGALACTIAE)ISOLATED TESTING AGAINST S. AGALACTIAE NOT ROUTINELY PERFORMED DUE TO PREDICTABILITY OF AMP/PEN/VAN SUSCEPTIBILITY. Performed at [facility], [address] [place], [place] [address]    Report Status 11/15/2018 FINAL  Final   Organism ID, Bacteria ESCHERICHIA COLI (A)  Final      Susceptibility   Escherichia coli - MIC*    AMPICILLIN >=32 RESISTANT Resistant     CEFAZOLIN >=64 RESISTANT Resistant     CEFTRIAXONE >=64 RESISTANT Resistant     CIPROFLOXACIN <=0.25 SENSITIVE Sensitive     GENTAMICIN <=1 SENSITIVE Sensitive     IMIPENEM <=0.25 SENSITIVE Sensitive     NITROFURANTOIN 32 SENSITIVE Sensitive     TRIMETH/SULFA >=320 RESISTANT Resistant     AMPICILLIN/SULBACTAM 8 SENSITIVE Sensitive     PIP/TAZO <=4 SENSITIVE Sensitive     Extended ESBL POSITIVE Resistant     * >=100,000 COLONIES/mL ESCHERICHIA COLI  Blood Culture (routine x 2)     Status: None (Preliminary result)   Collection Time: 11/13/18  4:36 PM   Specimen:  BLOOD RIGHT FOREARM  Result Value Ref Range Status   Specimen Description   Final    BLOOD RIGHT FOREARM Performed at [facility], [address] [place], [place] [address]    Special Requests   Final    BOTTLES DRAWN AEROBIC ONLY Blood Culture results may not be optimal due to an inadequate volume of blood received in culture bottles Performed at [facility], [address] [place] [address], [place], [place] [address]    Culture   Final    NO GROWTH < 24 HOURS Performed at [facility], [address], [place], [place] [address]    Report Status PENDING  Incomplete  SARS Coronavirus 2 by RT PCR (hospital order, performed in [facility] hospital lab) Nasopharyngeal Nasopharyngeal Swab     Status: None   Collection Time: 11/13/18  6:44 PM   Specimen: Nasopharyngeal Swab  Result Value Ref Range Status   SARS Coronavirus 2 NEGATIVE NEGATIVE Final    Comment: (NOTE) If result is NEGATIVE SARS-CoV-2 target nucleic acids are NOT DETECTED. The SARS-CoV-2 RNA is generally detectable in upper and lower  respiratory specimens during the acute phase of infection. The lowest  concentration of SARS-CoV-2 viral copies this assay can detect is 250  copies / mL. A negative result does not preclude SARS-CoV-2 infection  and should not be used as the sole basis for treatment or other  patient management decisions.  A negative result may occur with  improper specimen collection / handling, submission of specimen other  than nasopharyngeal swab, presence of viral mutation(s) within the  areas targeted by this assay, and inadequate number of viral copies  (<250 copies / mL). A negative result must be combined with clinical  observations, patient history, and epidemiological information. If result is POSITIVE SARS-CoV-2 target nucleic acids are DETECTED. The SARS-CoV-2 RNA is generally detectable in upper and lower  respiratory specimens dur ing the acute phase of infection.   Positive  results are indicative of active infection with SARS-CoV-2.  Clinical  correlation with patient history and other diagnostic information is  necessary to determine patient infection status.  Positive results do  not rule out bacterial infection or co-infection with other viruses. If result is PRESUMPTIVE POSTIVE SARS-CoV-2 nucleic acids MAY BE PRESENT.   A presumptive positive result was obtained on the submitted specimen  and confirmed on repeat testing.  While 2019 novel coronavirus  (SARS-CoV-2) nucleic acids may be present in the submitted sample  additional confirmatory testing may be necessary for epidemiological  and / or clinical management purposes  to differentiate between  SARS-CoV-2 and other Sarbecovirus currently known to infect humans.  If clinically indicated additional testing with an alternate test  methodology [phone]) is advised. The SARS-CoV-2 RNA is generally  detectable in upper and lower respiratory sp ecimens during the acute  phase of infection. The expected result is Negative. Fact Sheet for Patients:  BoilerBrush.com.cy Fact Sheet for Healthcare Providers: https://pope.com/ This test is not yet approved or cleared by the [place] FDA and has been authorized for detection and/or diagnosis of SARS-CoV-2 by FDA under an Emergency Use Authorization (EUA).  This EUA will remain in effect (meaning this test can be used) for the duration of the COVID-19 declaration under Section 564(b)(1) of the Act, 21 U.S.C. section 360bbb-3(b)(1), unless the authorization is terminated or revoked sooner. Performed at [facility], [address], [place], [place] [address]     Gardiner Barefoot, MD [facility] [email or web address] 11/15/2018, 1:16 PM

## 2018-11-15 NOTE — Progress Notes (Signed)
  Echocardiogram 2D Echocardiogram has been performed.  Amanda Ray 11/15/2018, 3:07 PM

## 2018-11-15 NOTE — Plan of Care (Signed)
Patient lying in bed this morning; complains of pressure on right side of face; no other concerns noted. Will continue to monitor.

## 2018-11-15 NOTE — Consult Note (Signed)
Reason for Consult:right facial cellulitis Referring Physician: Burnard Leigh, DO  Amanda Ray is an 33 y.o. female.  CC: right facial swelling    HPI: 2 day h/o right facial swelling.Began as a small pimple and g progressed to swelling of   cheek and under  right eye. Had upper right  tooth that chipped some time ago and did not see dentist.   Past Medical History:  Diagnosis Date  . Abscess   . Asthma     History reviewed. No pertinent surgical history.  History reviewed. No pertinent family history.  Social History:  reports that she has been smoking. She uses smokeless tobacco. No history on file for alcohol and drug.  Allergies: No Known Allergies  Medications: I have reviewed the patient's current medications.  Results for orders placed or performed during the hospital encounter of 11/13/18 (from the past 48 hour(s))  Lactic acid, plasma     Status: Abnormal   Collection Time: 11/13/18  3:40 PM  Result Value Ref Range   Lactic Acid, Venous 2.1 (HH) 0.5 - 1.9 mmol/L    Comment: CRITICAL RESULT CALLED TO, READ BACK BY AND VERIFIED WITH: M.GRIFFIN AT 1630 ON 11/13/18 BY N.THOMPSON Performed at [facility], [address], [place], [place] [address]   Comprehensive metabolic panel     Status: Abnormal   Collection Time: 11/13/18  3:40 PM  Result Value Ref Range   Sodium 132 (L) 135 - 145 mmol/L   Potassium 3.8 3.5 - 5.1 mmol/L   Chloride 94 (L) 98 - 111 mmol/L   CO2 25 22 - 32 mmol/L   Glucose, Bld 156 (H) 70 - 99 mg/dL   BUN 7 6 - 20 mg/dL   Creatinine, Ser 6.04 0.44 - 1.00 mg/dL   Calcium 9.7 8.9 - 54.0 mg/dL   Total Protein 9.0 (H) 6.5 - 8.1 g/dL   Albumin 4.3 3.5 - 5.0 g/dL   AST 23 15 - 41 U/L   ALT 13 0 - 44 U/L   Alkaline Phosphatase 104 38 - 126 U/L   Total Bilirubin 0.6 0.3 - 1.2 mg/dL   GFR calc non Af Amer >60 >60 mL/min   GFR calc Af Amer >60 >60 mL/min   Anion gap 13 5 - 15    Comment: Performed at [facility], [address], [place], [place] [address]  CBC with Differential     Status: Abnormal   Collection Time: 11/13/18  3:40 PM  Result Value Ref Range   WBC 34.7 (H) 4.0 - 10.5 K/uL   RBC 5.19 (H) 3.87 - 5.11 MIL/uL   Hemoglobin 15.2 (H) 12.0 - 15.0 g/dL   HCT 14.7 (H) 82.9 - 56.2 %   MCV 89.6 80.0 - 100.0 fL   MCH 29.3 26.0 - 34.0 pg   MCHC 32.7 30.0 - 36.0 g/dL   RDW 13.0 86.5 - 78.4 %   Platelets 343 150 - 400 K/uL   nRBC 0.0 0.0 - 0.2 %   Neutrophils Relative % 83 %   Neutro Abs 28.9 (H) 1.7 - 7.7 K/uL   Lymphocytes Relative 8 %   Lymphs Abs 2.7 0.7 - 4.0 K/uL   Monocytes Relative 8 %   Monocytes Absolute 2.7 (H) 0.1 - 1.0 K/uL   Eosinophils Relative 0 %   Eosinophils Absolute 0.0 0.0 - 0.5 K/uL   Basophils Relative 0 %   Basophils Absolute 0.1 0.0 - 0.1 K/uL   WBC Morphology TOXIC  GRANULATION    Immature Granulocytes 1 %   Abs Immature Granulocytes 0.24 (H) 0.00 - 0.07 K/uL    Comment: Performed at [facility], [address] [place], [place] [address]  Urinalysis, Routine w reflex microscopic     Status: Abnormal   Collection Time: 11/13/18  3:40 PM  Result Value Ref Range   Color, Urine YELLOW YELLOW   APPearance CLEAR CLEAR   Specific Gravity, Urine 1.015 1.005 - 1.030   pH 7.0 5.0 - 8.0   Glucose, UA NEGATIVE NEGATIVE mg/dL   Hgb urine dipstick SMALL (A) NEGATIVE   Bilirubin Urine NEGATIVE NEGATIVE   Ketones, ur NEGATIVE NEGATIVE mg/dL   Protein, ur NEGATIVE NEGATIVE mg/dL   Nitrite NEGATIVE NEGATIVE   Leukocytes,Ua NEGATIVE NEGATIVE   RBC / HPF 0-5 0 - 5 RBC/hpf   WBC, UA 0-5 0 - 5 WBC/hpf   Bacteria, UA NONE SEEN NONE SEEN   Squamous Epithelial / LPF 0-5 0 - 5    Comment: Performed at [facility], [address], [place], [place] [address]  I-Stat beta hCG blood, ED     Status: None   Collection Time: 11/13/18  3:58 PM  Result Value Ref Range   I-stat hCG, quantitative <5.0 <5 mIU/mL   Comment 3             Comment:   GEST. AGE      CONC.  (mIU/mL)   <=1 WEEK        5 - 50     2 WEEKS       50 - 500     3 WEEKS       100 - 10,000     4 WEEKS     1,000 - 30,000        FEMALE AND NON-PREGNANT FEMALE:     LESS THAN 5 mIU/mL   Blood Culture (routine x 2)     Status: None (Preliminary result)   Collection Time: 11/13/18  4:08 PM   Specimen: BLOOD  Result Value Ref Range   Specimen Description      BLOOD LEFT ANTECUBITAL Performed at [facility], [address], [place], [place] [address]    Special Requests      BOTTLES DRAWN AEROBIC AND ANAEROBIC Blood Culture results may not be optimal due to an excessive volume of blood received in culture bottles Performed at [facility], [address] [place], [place] [address]    Culture  Setup Time      GRAM POSITIVE COCCI IN CLUSTERS ANAEROBIC BOTTLE ONLY Organism ID to follow CRITICAL RESULT CALLED TO, READ BACK BY AND VERIFIED WITHTrixie Deis, AT 1308 11/15/18 BY D.VANHOOK Performed at [facility], [address], [place], [place] [address]    Culture GRAM POSITIVE COCCI    Report Status PENDING   Blood Culture ID Panel (Reflexed)     Status: Abnormal   Collection Time: 11/13/18  4:08 PM  Result Value Ref Range   Enterococcus species NOT DETECTED NOT DETECTED   Listeria monocytogenes NOT DETECTED NOT DETECTED   Staphylococcus species DETECTED (A) NOT DETECTED    Comment: CRITICAL RESULT CALLED TO, READ BACK BY AND VERIFIED WITH: Peggyann Juba PHARMD, AT [phone] 11/15/18 BY D. VANHOOK    Staphylococcus aureus (BCID) DETECTED (A) NOT DETECTED    Comment: Methicillin (oxacillin)-resistant Staphylococcus aureus (MRSA). MRSA is predictably resistant to beta-lactam antibiotics (except ceftaroline). Preferred therapy is  vancomycin unless clinically contraindicated. Patient requires contact precautions if  hospitalized. CRITICAL RESULT CALLED TO, READ BACK BY AND VERIFIED WITH: Lavell Luster  PHARMD, AT [phone] 11/15/18 BY D. VANHOOK    Methicillin resistance DETECTED (A) NOT DETECTED    Comment: CRITICAL RESULT CALLED TO, READ BACK BY AND VERIFIED WITH: Lavell Luster PHARMD, AT 6734 11/15/18 BY D. VANHOOK    Streptococcus species NOT DETECTED NOT DETECTED   Streptococcus agalactiae NOT DETECTED NOT DETECTED   Streptococcus pneumoniae NOT DETECTED NOT DETECTED   Streptococcus pyogenes NOT DETECTED NOT DETECTED   Acinetobacter baumannii NOT DETECTED NOT DETECTED   Enterobacteriaceae species NOT DETECTED NOT DETECTED   Enterobacter cloacae complex NOT DETECTED NOT DETECTED   Escherichia coli NOT DETECTED NOT DETECTED   Klebsiella oxytoca NOT DETECTED NOT DETECTED   Klebsiella pneumoniae NOT DETECTED NOT DETECTED   Proteus species NOT DETECTED NOT DETECTED   Serratia marcescens NOT DETECTED NOT DETECTED   Haemophilus influenzae NOT DETECTED NOT DETECTED   Neisseria meningitidis NOT DETECTED NOT DETECTED   Pseudomonas aeruginosa NOT DETECTED NOT DETECTED   Candida albicans NOT DETECTED NOT DETECTED   Candida glabrata NOT DETECTED NOT DETECTED   Candida krusei NOT DETECTED NOT DETECTED   Candida parapsilosis NOT DETECTED NOT DETECTED   Candida tropicalis NOT DETECTED NOT DETECTED    Comment: Performed at [facility], [address] [place], [place] [address]  Urine culture     Status: Abnormal   Collection Time: 11/13/18  4:11 PM   Specimen: In/Out Cath Urine  Result Value Ref Range   Specimen Description      IN/OUT CATH URINE Performed at [facility], [place] [address], [place], [place] [address]    Special Requests      NONE Performed at [facility], [place] [address] [place], [place] [address]    Culture (A)     >=100,000 COLONIES/mL ESCHERICHIA COLI Confirmed Extended Spectrum Beta-Lactamase Producer (ESBL).  In bloodstream infections from ESBL organisms, carbapenems are preferred over piperacillin/tazobactam. They are shown  to have a lower risk of mortality. 10,000 COLONIES/mL GROUP B STREP(S.AGALACTIAE)ISOLATED TESTING AGAINST S. AGALACTIAE NOT ROUTINELY PERFORMED DUE TO PREDICTABILITY OF AMP/PEN/VAN SUSCEPTIBILITY. Performed at [facility], [place] [address] [place], [place] [address]    Report Status 11/15/2018 FINAL    Organism ID, Bacteria ESCHERICHIA COLI (A)       Susceptibility   Escherichia coli - MIC*    AMPICILLIN >=32 RESISTANT Resistant     CEFAZOLIN >=64 RESISTANT Resistant     CEFTRIAXONE >=64 RESISTANT Resistant     CIPROFLOXACIN <=0.25 SENSITIVE Sensitive     GENTAMICIN <=1 SENSITIVE Sensitive     IMIPENEM <=0.25 SENSITIVE Sensitive     NITROFURANTOIN 32 SENSITIVE Sensitive     TRIMETH/SULFA >=320 RESISTANT Resistant     AMPICILLIN/SULBACTAM 8 SENSITIVE Sensitive     PIP/TAZO <=4 SENSITIVE Sensitive     Extended ESBL POSITIVE Resistant     * >=100,000 COLONIES/mL ESCHERICHIA COLI  Blood Culture (routine x 2)     Status: None (Preliminary result)   Collection Time: 11/13/18  4:36 PM   Specimen: BLOOD RIGHT FOREARM  Result Value Ref Range   Specimen Description      BLOOD RIGHT FOREARM Performed at [facility], [place] [address] [place], [place] [address]    Special Requests      BOTTLES DRAWN AEROBIC ONLY Blood Culture results may not be optimal due to an inadequate  volume of blood received in culture bottles Performed at [facility], [address], [place], [place] [address]    Culture      NO GROWTH < 24 HOURS Performed at [facility], [address] [place], [place] [address]    Report Status PENDING   SARS Coronavirus 2 by RT PCR (hospital order, performed in [facility] hospital lab) Nasopharyngeal Nasopharyngeal Swab     Status: None   Collection Time: 11/13/18  6:44 PM   Specimen: Nasopharyngeal Swab  Result Value Ref Range   SARS Coronavirus 2 NEGATIVE NEGATIVE    Comment: (NOTE) If result is NEGATIVE SARS-CoV-2 target nucleic  acids are NOT DETECTED. The SARS-CoV-2 RNA is generally detectable in upper and lower  respiratory specimens during the acute phase of infection. The lowest  concentration of SARS-CoV-2 viral copies this assay can detect is 250  copies / mL. A negative result does not preclude SARS-CoV-2 infection  and should not be used as the sole basis for treatment or other  patient management decisions.  A negative result may occur with  improper specimen collection / handling, submission of specimen other  than nasopharyngeal swab, presence of viral mutation(s) within the  areas targeted by this assay, and inadequate number of viral copies  (<250 copies / mL). A negative result must be combined with clinical  observations, patient history, and epidemiological information. If result is POSITIVE SARS-CoV-2 target nucleic acids are DETECTED. The SARS-CoV-2 RNA is generally detectable in upper and lower  respiratory specimens dur ing the acute phase of infection.  Positive  results are indicative of active infection with SARS-CoV-2.  Clinical  correlation with patient history and other diagnostic information is  necessary to determine patient infection status.  Positive results do  not rule out bacterial infection or co-infection with other viruses. If result is PRESUMPTIVE POSTIVE SARS-CoV-2 nucleic acids MAY BE PRESENT.   A presumptive positive result was obtained on the submitted specimen  and confirmed on repeat testing.  While 2019 novel coronavirus  (SARS-CoV-2) nucleic acids may be present in the submitted sample  additional confirmatory testing may be necessary for epidemiological  and / or clinical management purposes  to differentiate between  SARS-CoV-2 and other Sarbecovirus currently known to infect humans.  If clinically indicated additional testing with an alternate test  methodology [phone]) is advised. The SARS-CoV-2 RNA is generally  detectable in upper and lower respiratory  sp ecimens during the acute  phase of infection. The expected result is Negative. Fact Sheet for Patients:  BoilerBrush.com.cy Fact Sheet for Healthcare Providers: https://pope.com/ This test is not yet approved or cleared by the [place] FDA and has been authorized for detection and/or diagnosis of SARS-CoV-2 by FDA under an Emergency Use Authorization (EUA).  This EUA will remain in effect (meaning this test can be used) for the duration of the COVID-19 declaration under Section 564(b)(1) of the Act, 21 U.S.C. section 360bbb-3(b)(1), unless the authorization is terminated or revoked sooner. Performed at [facility], [address], [place], [place] [address]   Lactic acid, plasma     Status: None   Collection Time: 11/13/18  7:05 PM  Result Value Ref Range   Lactic Acid, Venous 1.4 0.5 - 1.9 mmol/L    Comment: Performed at [facility], [address] [place], [place] [address]  HIV Antibody (routine testing w rflx)     Status: None   Collection Time: 11/13/18 10:33 PM  Result Value Ref Range   HIV Screen 4th Generation wRfx NON REACTIVE NON REACTIVE    Comment: Performed at [facility], [address] [place], [place] [address]  Lactic acid, plasma     Status: None   Collection Time: 11/13/18 10:33 PM  Result Value Ref Range   Lactic Acid, Venous 1.4 0.5 - 1.9 mmol/L    Comment: Performed at [facility], [address], [place], [place] [address]  Basic metabolic panel     Status: Abnormal   Collection Time: 11/14/18  1:47 AM  Result Value Ref Range   Sodium 134 (L) 135 - 145 mmol/L   Potassium 3.7 3.5 - 5.1 mmol/L   Chloride 96 (L) 98 - 111 mmol/L   CO2 25 22 - 32 mmol/L   Glucose, Bld 162 (H) 70 - 99 mg/dL   BUN 9 6 - 20 mg/dL   Creatinine, Ser 0.98 0.44 - 1.00 mg/dL   Calcium 9.3 8.9 - 11.9 mg/dL   GFR calc non Af Amer >60 >60 mL/min   GFR calc Af  Amer >60 >60 mL/min   Anion gap 13 5 - 15    Comment: Performed at [facility], [address], [place], [place] [address]  CBC     Status: Abnormal   Collection Time: 11/14/18  1:47 AM  Result Value Ref Range   WBC 27.7 (H) 4.0 - 10.5 K/uL   RBC 4.72 3.87 - 5.11 MIL/uL   Hemoglobin 13.9 12.0 - 15.0 g/dL   HCT 95.6 21.3 - 08.6 %   MCV 89.8 80.0 - 100.0 fL   MCH 29.4 26.0 - 34.0 pg   MCHC 32.8 30.0 - 36.0 g/dL   RDW 57.8 46.9 - 62.9 %   Platelets 323 150 - 400 K/uL   nRBC 0.0 0.0 - 0.2 %    Comment: Performed at [facility], [address], [place], [place] [address]  Lactic acid, plasma     Status: Abnormal   Collection Time: 11/14/18  1:47 AM  Result Value Ref Range   Lactic Acid, Venous 2.2 (HH) 0.5 - 1.9 mmol/L    Comment: CRITICAL RESULT CALLED TO, READ BACK BY AND VERIFIED WITH: Marylynn Pearson, RACHELLE @ 0237 ON 11/14/2018 C VARNER Performed at [facility], [address] [place], [place] [address]   Phosphorus     Status: None   Collection Time: 11/15/18  4:29 AM  Result Value Ref Range   Phosphorus 2.5 2.5 - 4.6 mg/dL    Comment: Performed at [facility], [address] [place], [place] [address]  Magnesium     Status: None   Collection Time: 11/15/18  4:29 AM  Result Value Ref Range   Magnesium 2.3 1.7 - 2.4 mg/dL    Comment: Performed at [facility], [address] [place], [place] [address]  CBC with Differential/Platelet     Status: Abnormal   Collection Time: 11/15/18  4:29 AM  Result Value Ref Range   WBC 20.7 (H) 4.0 - 10.5 K/uL   RBC 4.15 3.87 - 5.11 MIL/uL   Hemoglobin 12.2 12.0 - 15.0 g/dL   HCT 40.3 47.4 - 25.9 %   MCV 90.4 80.0 - 100.0 fL   MCH 29.4 26.0 - 34.0 pg   MCHC 32.5 30.0 - 36.0 g/dL   RDW 56.3 87.5 - 64.3 %   Platelets 306 150 - 400 K/uL   nRBC 0.0 0.0 -  0.2 %   Neutrophils Relative % 73 %   Neutro Abs 15.2 (H) 1.7 - 7.7 K/uL   Lymphocytes  Relative 16 %   Lymphs Abs 3.4 0.7 - 4.0 K/uL   Monocytes Relative 8 %   Monocytes Absolute 1.6 (H) 0.1 - 1.0 K/uL   Eosinophils Relative 1 %   Eosinophils Absolute 0.2 0.0 - 0.5 K/uL   Basophils Relative 1 %   Basophils Absolute 0.1 0.0 - 0.1 K/uL   Immature Granulocytes 1 %   Abs Immature Granulocytes 0.15 (H) 0.00 - 0.07 K/uL    Comment: Performed at [facility], [address] [place], [place] [address]  Comprehensive metabolic panel     Status: Abnormal   Collection Time: 11/15/18  4:29 AM  Result Value Ref Range   Sodium 137 135 - 145 mmol/L   Potassium 4.2 3.5 - 5.1 mmol/L   Chloride 105 98 - 111 mmol/L   CO2 22 22 - 32 mmol/L   Glucose, Bld 152 (H) 70 - 99 mg/dL   BUN 5 (L) 6 - 20 mg/dL   Creatinine, Ser 6.04 0.44 - 1.00 mg/dL   Calcium 9.0 8.9 - 54.0 mg/dL   Total Protein 7.4 6.5 - 8.1 g/dL   Albumin 3.3 (L) 3.5 - 5.0 g/dL   AST 30 15 - 41 U/L   ALT 23 0 - 44 U/L   Alkaline Phosphatase 97 38 - 126 U/L   Total Bilirubin 0.5 0.3 - 1.2 mg/dL   GFR calc non Af Amer >60 >60 mL/min   GFR calc Af Amer >60 >60 mL/min   Anion gap 10 5 - 15    Comment: Performed at [facility], [address] [place], [place] [address]  Procalcitonin - Baseline     Status: None   Collection Time: 11/15/18  4:29 AM  Result Value Ref Range   Procalcitonin 0.15 ng/mL    Comment:        Interpretation: PCT (Procalcitonin) <= 0.5 ng/mL: Systemic infection (sepsis) is not likely. Local bacterial infection is possible. (NOTE)       Sepsis PCT Algorithm           Lower Respiratory Tract                                      Infection PCT Algorithm    ----------------------------     ----------------------------         PCT < 0.25 ng/mL                PCT < 0.10 ng/mL         Strongly encourage             Strongly discourage   discontinuation of antibiotics    initiation of antibiotics    ----------------------------     -----------------------------        PCT 0.25 - 0.50 ng/mL            PCT 0.10 - 0.25 ng/mL               OR       >80% decrease in PCT            Discourage initiation of  antibiotics      Encourage discontinuation           of antibiotics    ----------------------------     -----------------------------         PCT >= 0.50 ng/mL              PCT 0.26 - 0.50 ng/mL               AND        <80% decrease in PCT             Encourage initiation of                                             antibiotics       Encourage continuation           of antibiotics    ----------------------------     -----------------------------        PCT >= 0.50 ng/mL                  PCT > 0.50 ng/mL               AND         increase in PCT                  Strongly encourage                                      initiation of antibiotics    Strongly encourage escalation           of antibiotics                                     -----------------------------                                           PCT <= 0.25 ng/mL                                                 OR                                        > 80% decrease in PCT                                     Discontinue / Do not initiate                                             antibiotics Performed at [facility], [address] [place], [place] [id number]     Ct Maxillofacial W Contrast  Result Date: 11/13/2018 CLINICAL DATA:  Cellulitis  of face. Additional history provided: Patient reports temple to right side of face since yesterday, swelling started last night. EXAM: CT MAXILLOFACIAL WITH CONTRAST TECHNIQUE: Multidetector CT imaging of the maxillofacial structures was performed with intravenous contrast. Multiplanar CT image reconstructions were also generated. CONTRAST:  33mL OMNIPAQUE IOHEXOL 300 MG/ML  SOLN COMPARISON:  No pertinent prior studies available for comparison. FINDINGS: Osseous: There are multiple  carious teeth. Subtle periapical lucency surrounding the left lower second premolar and first molar. Orbits: Right periorbital cellulitis. No definite evidence of postseptal extension on the current exam. Sinuses: No significant paranasal sinus disease or mastoid effusion. Soft tissues: There is severe right maxillofacial soft tissue swelling and stranding consistent with cellulitis which extends to the right periorbital region. There is a subtle circumscribed low-attenuation region with peripheral enhancement in the right cheek soft tissues measuring 2.8 x 1.0 x 1.2 cm (AP x TV x CC) (series 3, image 60) (series 7, image 30). This may reflect very early abscess formation and extends anteriorly toward the right cheek skin surface (series 3, image 65). Cellulitis changes also extend to the right perimandibular region and upper right neck. Asymmetric prominence of the right parotid gland may reflect a reactive parotiditis. No appreciable mass or swelling within the nasopharynx, oral cavity, oropharynx, hypopharynx or larynx. Enlarged right cervical chain lymph nodes, likely reactive. An index right level II lymph node measures 16 mm in short axis Limited intracranial: Unremarkable IMPRESSION: 1. Severe right maxillofacial cellulitis extending to the right periorbital, right perimandibular and right upper neck soft tissues. No evidence of postseptal orbital extension on the current examination. 2. 2.8 cm subtle region of low attenuation with peripheral enhancement in the right cheek soft tissues, as described and which may reflect very early abscess formation. 3. Asymmetric prominence of the right parotid gland, which may reflect reactive parotiditis. Clinical correlation is recommended. 4. Right upper cervical lymphadenopathy, likely reactive. Electronically Signed   By: Jackey Loge   On: 11/13/2018 17:51    ROS Blood pressure 116/74, pulse 92, temperature 98.3 F (36.8 C), temperature source Oral, resp. rate  18, height 5\' 5"  (1.651 m), weight 45.4 kg, last menstrual period 10/30/2018, SpO2 99 %. Head: Normocephalic, without obvious abnormality, atraumatic Eyes: conjunctivae/corneas clear. PERRL, EOM's intact. Fundi benign., right infraorbital edema and erythema Nose: Nares normal. Septum midline. Mucosa normal. No drainage or sinus tenderness. Throat: right buccal edema, firm, non-fluctuant. Large carious lesions teeth #4, 19. Pharynx clear. no trismus.  Neck: supple, symmetrical, trachea midline, thyroid not enlarged, symmetric, no tenderness/mass/nodules and right cervical adenopathy Resp: clear to auscultation bilaterally Cardio: regular rate and rhythm, S1, S2 normal, no murmur, click, rub or gallop  Assessment: right facial cellulitis probably secondary to carious tooth #4. Tooth #19 non-restorable secondary to dental caries. Other teeth with decay upper right quadrant. Plan: Extraction 4, 19, incision and drainage right buccal vestibule. EUA and extraction of indicated teeth right maxilla. GA.    11/01/2018 11/15/2018, 2:51 PM

## 2018-11-15 NOTE — Progress Notes (Signed)
Subjective: Right face is still swollen. The facial pain has slightly improved.  Objective: Vital signs in last 24 hours: Temp:  [98.2 F (36.8 C)-98.6 F (37 C)] 98.3 F (36.8 C) (10/10 1358) Pulse Rate:  [92-125] 92 (10/10 1358) Resp:  [12-20] 18 (10/10 1358) BP: (116-133)/(74-88) 116/74 (10/10 1358) SpO2:  [97 %-100 %] 99 % (10/10 1358)  Physical Exam Vitals signsand nursing notereviewed.  General: She is not in acute distress. She is well-developed.  Head and face: Normocephalicand atraumatic.  Right face with swelling and erythema. The swelling extends from the right mandibular region all the way up to the right preseptal region. There is tenderness and induration to the right cheek. No fluctuance or active drainage. There is anterior cervical and submental adenopathy present. Eyes: Pupils are equal, round, reactive to light. Extraocular motion is intact.  Ears: Examination of the ears shows normal auricles and external auditory canals bilaterally. Both tympanic membranes are intact.  Nose: Nasal examination shows normal mucosa, septum, turbinates.  Mouth: Oral cavity examination shows no mucosal lacerations. No significant trismus is noted.  Multiple dental carries. Pulmonary:  Pulmonary effort is normal. Norespiratory distress.  Skin:Skin is warm.  Neurological: She is alert and oriented. Psychiatric:  Behaviornormal.   Recent Labs    11/14/18 0147 11/15/18 0429  WBC 27.7* 20.7*  HGB 13.9 12.2  HCT 42.4 37.5  PLT 323 306   Recent Labs    11/14/18 0147 11/15/18 0429  NA 134* 137  K 3.7 4.2  CL 96* 105  CO2 25 22  GLUCOSE 162* 152*  BUN 9 5*  CREATININE 0.50 0.50  CALCIUM 9.3 9.0    Medications:  I have reviewed the patient's current medications. Scheduled: . cloNIDine  0.1 mg Oral QID   Followed by  . [START ON 11/17/2018] cloNIDine  0.1 mg Oral BH-qamhs   Followed by  . [START ON 11/19/2018] cloNIDine  0.1 mg Oral QAC breakfast  . enoxaparin  (LOVENOX) injection  40 mg Subcutaneous Q24H  . nicotine  14 mg Transdermal Daily   Continuous: . sodium chloride 100 mL/hr at 11/15/18 0951  . vancomycin 500 mg (11/15/18 7846)    Assessment/Plan: Right facial cellulitis. Facial pain has slightly improved. On Vanc per ID. WBC has decreased.  Will follow clinically. Daily CBC.   LOS: 2 days   Rudine Rieger W Doy Taaffe 11/15/2018, 2:35 PM

## 2018-11-15 NOTE — Progress Notes (Signed)
PHARMACY - PHYSICIAN COMMUNICATION CRITICAL VALUE ALERT - BLOOD CULTURE IDENTIFICATION (BCID)  Amanda Ray is an 33 y.o. female who presented to [facility] on 11/13/2018 with a chief complaint of facial swelling  Assessment:  Patient with + BCID and already started on vancomycin. (include suspected source if known)  Name of physician (or Provider) Contacted: none  Current antibiotics: vancomycin  Changes to prescribed antibiotics recommended:  Patient is on recommended antibiotics - No changes needed  Results for orders placed or performed during the hospital encounter of 11/13/18  Blood Culture ID Panel (Reflexed) (Collected: 11/13/2018  4:08 PM)  Result Value Ref Range   Enterococcus species NOT DETECTED NOT DETECTED   Listeria monocytogenes NOT DETECTED NOT DETECTED   Staphylococcus species DETECTED (A) NOT DETECTED   Staphylococcus aureus (BCID) DETECTED (A) NOT DETECTED   Methicillin resistance DETECTED (A) NOT DETECTED   Streptococcus species NOT DETECTED NOT DETECTED   Streptococcus agalactiae NOT DETECTED NOT DETECTED   Streptococcus pneumoniae NOT DETECTED NOT DETECTED   Streptococcus pyogenes NOT DETECTED NOT DETECTED   Acinetobacter baumannii NOT DETECTED NOT DETECTED   Enterobacteriaceae species NOT DETECTED NOT DETECTED   Enterobacter cloacae complex NOT DETECTED NOT DETECTED   Escherichia coli NOT DETECTED NOT DETECTED   Klebsiella oxytoca NOT DETECTED NOT DETECTED   Klebsiella pneumoniae NOT DETECTED NOT DETECTED   Proteus species NOT DETECTED NOT DETECTED   Serratia marcescens NOT DETECTED NOT DETECTED   Haemophilus influenzae NOT DETECTED NOT DETECTED   Neisseria meningitidis NOT DETECTED NOT DETECTED   Pseudomonas aeruginosa NOT DETECTED NOT DETECTED   Candida albicans NOT DETECTED NOT DETECTED   Candida glabrata NOT DETECTED NOT DETECTED   Candida krusei NOT DETECTED NOT DETECTED   Candida parapsilosis NOT DETECTED NOT DETECTED   Candida tropicalis NOT  DETECTED NOT DETECTED    [name] 11/15/2018  7:01 AM

## 2018-11-15 NOTE — Anesthesia Preprocedure Evaluation (Addendum)
Anesthesia Evaluation  Patient identified by MRN, date of birth, ID band Patient awake    Reviewed: Allergy & Precautions, H&P , NPO status , Patient's Chart, lab work & pertinent test results  Airway Mallampati: II  TM Distance: >3 FB Neck ROM: Full    Dental no notable dental hx. (+) Poor Dentition, Dental Advisory Given   Pulmonary asthma , Current Smoker and Patient abstained from smoking.,    Pulmonary exam normal breath sounds clear to auscultation       Cardiovascular Exercise Tolerance: Good negative cardio ROS   Rhythm:Regular Rate:Normal     Neuro/Psych negative neurological ROS  negative psych ROS   GI/Hepatic negative GI ROS, (+)     substance abuse  IV drug use,   Endo/Other  negative endocrine ROS  Renal/GU negative Renal ROS  negative genitourinary   Musculoskeletal  (+) narcotic dependent  Abdominal   Peds  Hematology negative hematology ROS (+)   Anesthesia Other Findings   Reproductive/Obstetrics negative OB ROS                            Anesthesia Physical Anesthesia Plan  ASA: II  Anesthesia Plan: General   Post-op Pain Management:    Induction: Intravenous  PONV Risk Score and Plan: 3 and Ondansetron, Dexamethasone and Midazolam  Airway Management Planned: Nasal ETT and Video Laryngoscope Planned  Additional Equipment:   Intra-op Plan:   Post-operative Plan: Extubation in OR  Informed Consent: I have reviewed the patients History and Physical, chart, labs and discussed the procedure including the risks, benefits and alternatives for the proposed anesthesia with the patient or authorized representative who has indicated his/her understanding and acceptance.     Dental advisory given  Plan Discussed with: CRNA  Anesthesia Plan Comments:         Anesthesia Quick Evaluation

## 2018-11-15 NOTE — Progress Notes (Signed)
PROGRESS NOTE    Bellamarie Pflug  XTG:[id number] DOB: Dec 26, 1985 DOA: 11/13/2018 PCP: Patient, No Pcp Per  Brief Narrative:  HPI per Dr. Orene Desanctis on 11/13/2018 Selen Smucker is a 33 y.o. female with medical history significant of asthma and polysubstance abuse who presented with severe right facial cellulitis.  Patient first noticed a small pimple to her right cheek about 2 days ago when swelling and redness progressively migrated up to her right eye.  Also notes she has been having issues with her upper right tooth that is chipped and has not seek medical care.  She endorses using IV heroin this morning prior to admission and her usual injection sites are on either side of her neck.  She denies any neck pain.  Denies any issues with swallowing or breathing.  She denies any vision changes or orbital pain.  She notes constant headache throughout her right side that is pressure-like.  She is nauseous but also hungry.    Patient endorsed half a pack tobacco use.  Last use IV heroin this morning and marijuana last night.  ED Course:   She had mildly elevated temperature of 99 and body hypertensive up to 145/1 60s and tachycardic up to 140s.  CBC had significant leukocytosis of 34.7 and a hemoglobin of 15.2.  CMP had mild hyponatremia 132 but otherwise stable creatinine and no other significant lab abnormalities.  Lactic acid of 2.1.  CT maxillofacial showed severe right maxillofacial area cellulitis extending to the right periorbital, right peri-mandible and right upper neck soft tissue.  No evidence of post septal orbital extension on the current examination.  2.8 cm subtle region of low attenuation with peripheral enhancement in the right cheek soft tissue which may represent early abscess formation.  Asymmetric prominence of the right parotid gland which may reflect parotiditis.  Right upper cervical lymphadenopathy.  **Interim History Ophthalmology, ENT and dentistry were consulted for further  evaluation.  Patient continues to have some nausea.  White blood cell count is trending down and sodium is slightly improving.  ENT and ophthalmology feel that there is nothing else to do for very and just recommending continue IV antibiotics.  Dental was consulted yesterday and I will speak with the oral surgeon on-call today Dr. Hoyt Koch who will evaluate and review her CT scan.  Assessment & Plan:   Active Problems:   Facial cellulitis  Sepsis Secondary to Right Maxillofacial Cellulitis with Periorbital involvement and ? Right Parotiditis questionable MRSA bacteremia - CT maxillofacial showed severe right maxillofacial area cellulitis extending to the right periorbital, right peri-mandible and right upper neck soft tissue.  -CT also mentioned "There are multiple carious teeth. Subtle periapical lucency surrounding the left lower second premolar and first molar." -Possible 2.6 cm abscess formation.  -Stable respiratory status- able to maintain airway -Continue vancomycin and Rocephin for now and re-evaluate for any need of I&D of potential abscess; I have asked ENT Dr. Benjamine Mola to evaluate and as well as Ophthalmology Dr. Manuella Ghazi -Dentistry to be consulted as well and I ordered Orthopantogram but unfortunately not done at [facility] now; unfortunately I was unable to reach Dr. Shelah Lewandowsky so I reached out to Dr. Hoyt Koch of Oral surgery who is on call and I asked him to review the CT scan; Dr. Hoyt Koch reviewed the CT scan of the few of her teeth are infected and recommends surgery and he states that he will try and schedule for tomorrow for further surgical evaluation and treatment -C/w Tylenol for mild pain, tramadol  for moderate pain, IV Morphine for severe pain -Blood cultures 1 out of 2 grew out MRSA; will Repeat blood cultures in AM  -Continue antibiotics that were started on admission with IV Vancomycin and she is getting it every 12 hours  -C/w Supportive Care and Antiemetics  -C/w Warm and Cold Compresses    -Continue to trend lactate and slightly worsened and went from 1.4 -> 2.2  -WBC went from 34.7 -> 27.7 -> 20.7 -C/w IV fluids with NS at 100 mL/hr for now -Continued to have Sinus Tachycardia but it is improving -Checked PCT Level this AM and was 0.15 -ID was consulted automatically given +Blood Cx with MRSA -Will continue IV Vancomycin; She was getting IV Ceftriaxone but this was stopped by ID -Infectious diseases are recommending a TTE and likely obtain a TEE -  Lactic Acidosis -LA went from 1.4 -> 2.2 -C/w IVF Hydration as above  Erythrocytosis -Likely Hemoconcentrated on admission and now Hgb/Hct dropped from 15.2/46.2 -> 13.9/42.4 -> 12.2/37.5 -Continue to Monitor and Trend -Repeat CBC in AM   Asthma -Stable -If necessary will need to place on Albuterol Neb prn  Polysubstance Abuse and Concern for Opioid Withdrawal -Last IV heroin use in the AM prior to admission; Injects in her neck -Marijuana use the night before -Counseling given; Will place Social Work consult to provide resources -Started Clonidine Detox Protocol   Tobacco Abuse -Smoking Cessation Counseling  -Started Nicotine 14 mg TD patch  Hyponatremia/Hypochloremia -Improving slightly  -Patient's Na+ went from 132 -> 134 -> 137 and Chloride went from 94 -> 96 -> 105 -Continue IVF as Above -Repeat CMP in AM  Hyperglycemia -Likely reactive in the setting of Infection -Glucose was 156 on admission and repeat was 162 this AM -Check HgbA1c -Continue to Monitor Blood Sugars carefully and if necessary will add Sensitive Novolog SSI  ESBL E Coli Colonization -Urinalysis was clear with yellow color urine, small hemoglobin, negative ketones, negative leukocytes, negative nitrites -Patient is not complaining of any symptoms -Urine culture showed greater than 100,000 colony-forming units of ESBL E. Coli -We will not treat this as this is not an active infection as she is asymptomatic   DVT prophylaxis:  Enoxaparin 40 mg sq q24h Code Status: FULL CODE Family Communication: No family present at bedside  Disposition Plan: Pending further clinical improvement   Consultants:   ENT Dr. Suszanne Conners  Opthamololgy Dr. Sherryll Burger  Dentistry Dr. Kristin Bruins  Oral Surgery Dr. Barbette Merino   Procedures: ECHOCARDIOGRAM ordered and pending to be done    Antimicrobials: Anti-infectives (From admission, onward)   Start     Dose/Rate Route Frequency Ordered Stop   11/14/18 1800  cefTRIAXone (ROCEPHIN) 2 g in sodium chloride 0.9 % 100 mL IVPB     2 g 200 mL/hr over 30 Minutes Intravenous Every 24 hours 11/14/18 0013     11/14/18 0600  vancomycin (VANCOCIN) 500 mg in sodium chloride 0.9 % 100 mL IVPB     500 mg 100 mL/hr over 60 Minutes Intravenous Every 12 hours 11/13/18 2112     11/13/18 1600  vancomycin (VANCOCIN) IVPB 1000 mg/200 mL premix     1,000 mg 200 mL/hr over 60 Minutes Intravenous  Once 11/13/18 1557 11/13/18 1829   11/13/18 1600  cefTRIAXone (ROCEPHIN) 2 g in sodium chloride 0.9 % 100 mL IVPB     2 g 200 mL/hr over 30 Minutes Intravenous  Once 11/13/18 1557 11/13/18 2023     Subjective: Seen and examined at bedside and states  that she is still hurting and that her face has a lot of pressure. States that the clonidine helps a little. No CP or SOB. Thinks she slept wrong and made her face more swollen. No other concerns or complaints at this time.   Objective: Vitals:   11/14/18 1812 11/14/18 1952 11/14/18 2043 11/15/18 0524  BP: 133/87 125/88  120/77  Pulse: (!) 120 (!) 125 (!) 103 95  Resp: Temp:  98.6 F (37 C)  98.2 F (36.8 C)  TempSrc:  Oral  Oral  SpO2: 100% 97%  100%  Weight:      Height:        Intake/Output Summary (Last 24 hours) at 11/15/2018 1322 Last data filed at 11/15/2018 1000 Gross per 24 hour  Intake 4093.12 ml  Output 3850 ml  Net 243.12 ml   Filed Weights   11/13/18 1632  Weight: 45.4 kg   Examination: Physical Exam:  Constitutional: WN/WD thin  Caucasian female NAD and uncomfortable  Eyes: Has a Cellulitic Right Orbit that is a little swollen ENMT: External Ears Normal. Right Side of her face is extremely swollen. She has very poor dentition  Neck: Swollen on the Right  Respiratory: Diminished to auscultation bilaterally, no wheezing, rales, rhonchi or crackles. Normal respiratory effort and patient is not tachypenic. No accessory muscle use.  Cardiovascular: Not as tachycardic but still has HR on the faster side, no murmurs / rubs / gallops. S1 and S2 auscultated.  Abdomen: Soft, non-tender, non-distended. Bowel sounds positive x4.  GU: Deferred. Musculoskeletal: No clubbing / cyanosis of digits/nails. No joint deformity upper and lower extremities. Skin: Right side of the face is cellulitic and warm to touch with swelling and has some crusting on the right side of her chin. .  Neurologic: CN 2-12 grossly intact with no focal deficits. Romberg sign and cerebellar reflexes not assessed.  Psychiatric: Normal judgment and insight. Alert and oriented x 3. Slightly anxious mood and appropriate affect.   Data Reviewed: I have personally reviewed following labs and imaging studies  CBC: Recent Labs  Lab 11/13/18 1540 11/14/18 0147 11/15/18 0429  WBC 34.7* 27.7* 20.7*  NEUTROABS 28.9*  --  15.2*  HGB 15.2* 13.9 12.2  HCT 46.5* 42.4 37.5  MCV 89.6 89.8 90.4  PLT 343 323 306   Basic Metabolic Panel: Recent Labs  Lab 11/13/18 1540 11/14/18 0147 11/15/18 0429  NA 132* 134* 137  K 3.8 3.7 4.2  CL 94* 96* 105  CO2 GLUCOSE 156* 162* 152*  BUN 7 9 5*  CREATININE 0.55 0.50 0.50  CALCIUM 9.7 9.3 9.0  MG  --   --  2.3  PHOS  --   --  2.5   GFR: Estimated Creatinine Clearance: 73 mL/min (by C-G formula based on SCr of 0.5 mg/dL). Liver Function Tests: Recent Labs  Lab 11/13/18 1540 11/15/18 0429  AST 23 30  ALT 13 23  ALKPHOS 104 97  BILITOT 0.6 0.5  PROT 9.0* 7.4  ALBUMIN 4.3 3.3*   No results for  input(s): LIPASE, AMYLASE in the last 168 hours. No results for input(s): AMMONIA in the last 168 hours. Coagulation Profile: No results for input(s): INR, PROTIME in the last 168 hours. Cardiac Enzymes: No results for input(s): CKTOTAL, CKMB, CKMBINDEX, TROPONINI in the last 168 hours. BNP (last 3 results) No results for input(s): PROBNP in the last 8760 hours. HbA1C: No results for input(s): HGBA1C in the last 72  hours. CBG: No results for input(s): GLUCAP in the last 168 hours. Lipid Profile: No results for input(s): CHOL, HDL, LDLCALC, TRIG, CHOLHDL, LDLDIRECT in the last 72 hours. Thyroid Function Tests: No results for input(s): TSH, T4TOTAL, FREET4, T3FREE, THYROIDAB in the last 72 hours. Anemia Panel: No results for input(s): VITAMINB12, FOLATE, FERRITIN, TIBC, IRON, RETICCTPCT in the last 72 hours. Sepsis Labs: Recent Labs  Lab 11/13/18 1540 11/13/18 1905 11/13/18 2233 11/14/18 0147 11/15/18 0429  PROCALCITON  --   --   --   --  0.15  LATICACIDVEN 2.1* 1.4 1.4 2.2*  --     Recent Results (from the past 240 hour(s))  Blood Culture (routine x 2)     Status: None (Preliminary result)   Collection Time: 11/13/18  4:08 PM   Specimen: BLOOD  Result Value Ref Range Status   Specimen Description   Final    BLOOD LEFT ANTECUBITAL Performed at [facility], [address], [place], [place] [address]    Special Requests   Final    BOTTLES DRAWN AEROBIC AND ANAEROBIC Blood Culture results may not be optimal due to an excessive volume of blood received in culture bottles Performed at [facility], [address], [place], [place] [address]    Culture  Setup Time   Final    GRAM POSITIVE COCCI IN CLUSTERS ANAEROBIC BOTTLE ONLY Organism ID to follow CRITICAL RESULT CALLED TO, READ BACK BY AND VERIFIED WITHTrixie Deis, AT 1914 11/15/18 BY D.VANHOOK Performed at [facility], [address], [place], [place] [address]     Culture GRAM POSITIVE COCCI  Final   Report Status PENDING  Incomplete  Blood Culture ID Panel (Reflexed)     Status: Abnormal   Collection Time: 11/13/18  4:08 PM  Result Value Ref Range Status   Enterococcus species NOT DETECTED NOT DETECTED Final   Listeria monocytogenes NOT DETECTED NOT DETECTED Final   Staphylococcus species DETECTED (A) NOT DETECTED Final    Comment: CRITICAL RESULT CALLED TO, READ BACK BY AND VERIFIED WITH: Peggyann Juba PHARMD, AT 6213 11/15/18 BY D. VANHOOK    Staphylococcus aureus (BCID) DETECTED (A) NOT DETECTED Final    Comment: Methicillin (oxacillin)-resistant Staphylococcus aureus (MRSA). MRSA is predictably resistant to beta-lactam antibiotics (except ceftaroline). Preferred therapy is vancomycin unless clinically contraindicated. Patient requires contact precautions if  hospitalized. CRITICAL RESULT CALLED TO, READ BACK BY AND VERIFIED WITH: Peggyann Juba PHARMD, AT 0865 11/15/18 BY D. VANHOOK    Methicillin resistance DETECTED (A) NOT DETECTED Final    Comment: CRITICAL RESULT CALLED TO, READ BACK BY AND VERIFIED WITH: Peggyann Juba PHARMD, AT 7846 11/15/18 BY D. VANHOOK    Streptococcus species NOT DETECTED NOT DETECTED Final   Streptococcus agalactiae NOT DETECTED NOT DETECTED Final   Streptococcus pneumoniae NOT DETECTED NOT DETECTED Final   Streptococcus pyogenes NOT DETECTED NOT DETECTED Final   Acinetobacter baumannii NOT DETECTED NOT DETECTED Final   Enterobacteriaceae species NOT DETECTED NOT DETECTED Final   Enterobacter cloacae complex NOT DETECTED NOT DETECTED Final   Escherichia coli NOT DETECTED NOT DETECTED Final   Klebsiella oxytoca NOT DETECTED NOT DETECTED Final   Klebsiella pneumoniae NOT DETECTED NOT DETECTED Final   Proteus species NOT DETECTED NOT DETECTED Final   Serratia marcescens NOT DETECTED NOT DETECTED Final   Haemophilus influenzae NOT DETECTED NOT DETECTED Final   Neisseria meningitidis NOT DETECTED NOT DETECTED Final    Pseudomonas aeruginosa NOT DETECTED NOT DETECTED Final  Candida albicans NOT DETECTED NOT DETECTED Final   Candida glabrata NOT DETECTED NOT DETECTED Final   Candida krusei NOT DETECTED NOT DETECTED Final   Candida parapsilosis NOT DETECTED NOT DETECTED Final   Candida tropicalis NOT DETECTED NOT DETECTED Final    Comment: Performed at [facility], [address] [place], [place] [address]  Urine culture     Status: Abnormal   Collection Time: 11/13/18  4:11 PM   Specimen: In/Out Cath Urine  Result Value Ref Range Status   Specimen Description   Final    IN/OUT CATH URINE Performed at [facility], [address] [place], [place] [address]    Special Requests   Final    NONE Performed at [facility], [address] [place], [place] [address]    Culture (A)  Final    >=100,000 COLONIES/mL ESCHERICHIA COLI Confirmed Extended Spectrum Beta-Lactamase Producer (ESBL).  In bloodstream infections from ESBL organisms, carbapenems are preferred over piperacillin/tazobactam. They are shown to have a lower risk of mortality. 10,000 COLONIES/mL GROUP B STREP(S.AGALACTIAE)ISOLATED TESTING AGAINST S. AGALACTIAE NOT ROUTINELY PERFORMED DUE TO PREDICTABILITY OF AMP/PEN/VAN SUSCEPTIBILITY. Performed at [facility], [address] [place], [place] [address]    Report Status 11/15/2018 FINAL  Final   Organism ID, Bacteria ESCHERICHIA COLI (A)  Final      Susceptibility   Escherichia coli - MIC*    AMPICILLIN >=32 RESISTANT Resistant     CEFAZOLIN >=64 RESISTANT Resistant     CEFTRIAXONE >=64 RESISTANT Resistant     CIPROFLOXACIN <=0.25 SENSITIVE Sensitive     GENTAMICIN <=1 SENSITIVE Sensitive     IMIPENEM <=0.25 SENSITIVE Sensitive     NITROFURANTOIN 32 SENSITIVE Sensitive     TRIMETH/SULFA >=320 RESISTANT Resistant     AMPICILLIN/SULBACTAM 8 SENSITIVE Sensitive     PIP/TAZO <=4 SENSITIVE Sensitive     Extended ESBL POSITIVE Resistant       * >=100,000 COLONIES/mL ESCHERICHIA COLI  Blood Culture (routine x 2)     Status: None (Preliminary result)   Collection Time: 11/13/18  4:36 PM   Specimen: BLOOD RIGHT FOREARM  Result Value Ref Range Status   Specimen Description   Final    BLOOD RIGHT FOREARM Performed at [facility], [address] [place], [place] [address]    Special Requests   Final    BOTTLES DRAWN AEROBIC ONLY Blood Culture results may not be optimal due to an inadequate volume of blood received in culture bottles Performed at [facility], [address] [place], [place] [address]    Culture   Final    NO GROWTH < 24 HOURS Performed at [facility], [address] [place], [place] [address]    Report Status PENDING  Incomplete  SARS Coronavirus 2 by RT PCR (hospital order, performed in [facility] hospital lab) Nasopharyngeal Nasopharyngeal Swab     Status: None   Collection Time: 11/13/18  6:44 PM   Specimen: Nasopharyngeal Swab  Result Value Ref Range Status   SARS Coronavirus 2 NEGATIVE NEGATIVE Final    Comment: (NOTE) If result is NEGATIVE SARS-CoV-2 target nucleic acids are NOT DETECTED. The SARS-CoV-2 RNA is generally detectable in upper and lower  respiratory specimens during the acute phase of infection. The lowest  concentration of SARS-CoV-2 viral copies this assay can detect is 250  copies / mL. A negative result does not preclude SARS-CoV-2 infection  and should not  be used as the sole basis for treatment or other  patient management decisions.  A negative result may occur with  improper specimen collection / handling, submission of specimen other  than nasopharyngeal swab, presence of viral mutation(s) within the  areas targeted by this assay, and inadequate number of viral copies  (<250 copies / mL). A negative result must be combined with clinical  observations, patient history, and epidemiological information. If result is POSITIVE SARS-CoV-2  target nucleic acids are DETECTED. The SARS-CoV-2 RNA is generally detectable in upper and lower  respiratory specimens dur ing the acute phase of infection.  Positive  results are indicative of active infection with SARS-CoV-2.  Clinical  correlation with patient history and other diagnostic information is  necessary to determine patient infection status.  Positive results do  not rule out bacterial infection or co-infection with other viruses. If result is PRESUMPTIVE POSTIVE SARS-CoV-2 nucleic acids MAY BE PRESENT.   A presumptive positive result was obtained on the submitted specimen  and confirmed on repeat testing.  While 2019 novel coronavirus  (SARS-CoV-2) nucleic acids may be present in the submitted sample  additional confirmatory testing may be necessary for epidemiological  and / or clinical management purposes  to differentiate between  SARS-CoV-2 and other Sarbecovirus currently known to infect humans.  If clinically indicated additional testing with an alternate test  methodology [phone]) is advised. The SARS-CoV-2 RNA is generally  detectable in upper and lower respiratory sp ecimens during the acute  phase of infection. The expected result is Negative. Fact Sheet for Patients:  BoilerBrush.com.cy Fact Sheet for Healthcare Providers: https://pope.com/ This test is not yet approved or cleared by the [place] FDA and has been authorized for detection and/or diagnosis of SARS-CoV-2 by FDA under an Emergency Use Authorization (EUA).  This EUA will remain in effect (meaning this test can be used) for the duration of the COVID-19 declaration under Section 564(b)(1) of the Act, 21 U.S.C. section 360bbb-3(b)(1), unless the authorization is terminated or revoked sooner. Performed at [facility], [address] [place], [place] [address]     Radiology Studies: Ct Maxillofacial W Contrast  Result  Date: 11/13/2018 CLINICAL DATA:  Cellulitis of face. Additional history provided: Patient reports temple to right side of face since yesterday, swelling started last night. EXAM: CT MAXILLOFACIAL WITH CONTRAST TECHNIQUE: Multidetector CT imaging of the maxillofacial structures was performed with intravenous contrast. Multiplanar CT image reconstructions were also generated. CONTRAST:  75mL OMNIPAQUE IOHEXOL 300 MG/ML  SOLN COMPARISON:  No pertinent prior studies available for comparison. FINDINGS: Osseous: There are multiple carious teeth. Subtle periapical lucency surrounding the left lower second premolar and first molar. Orbits: Right periorbital cellulitis. No definite evidence of postseptal extension on the current exam. Sinuses: No significant paranasal sinus disease or mastoid effusion. Soft tissues: There is severe right maxillofacial soft tissue swelling and stranding consistent with cellulitis which extends to the right periorbital region. There is a subtle circumscribed low-attenuation region with peripheral enhancement in the right cheek soft tissues measuring 2.8 x 1.0 x 1.2 cm (AP x TV x CC) (series 3, image 60) (series 7, image 30). This may reflect very early abscess formation and extends anteriorly toward the right cheek skin surface (series 3, image 65). Cellulitis changes also extend to the right perimandibular region and upper right neck. Asymmetric prominence of the right parotid gland may reflect a reactive parotiditis. No appreciable mass or swelling within the nasopharynx, oral cavity, oropharynx, hypopharynx or larynx. Enlarged  right cervical chain lymph nodes, likely reactive. An index right level II lymph node measures 16 mm in short axis Limited intracranial: Unremarkable IMPRESSION: 1. Severe right maxillofacial cellulitis extending to the right periorbital, right perimandibular and right upper neck soft tissues. No evidence of postseptal orbital extension on the current examination. 2.  2.8 cm subtle region of low attenuation with peripheral enhancement in the right cheek soft tissues, as described and which may reflect very early abscess formation. 3. Asymmetric prominence of the right parotid gland, which may reflect reactive parotiditis. Clinical correlation is recommended. 4. Right upper cervical lymphadenopathy, likely reactive. Electronically Signed   By: Jackey Loge   On: 11/13/2018 17:51   Scheduled Meds:  cloNIDine  0.1 mg Oral QID   Followed by   Melene Muller ON 11/17/2018] cloNIDine  0.1 mg Oral BH-qamhs   Followed by   Melene Muller ON 11/19/2018] cloNIDine  0.1 mg Oral QAC breakfast   enoxaparin (LOVENOX) injection  40 mg Subcutaneous Q24H   nicotine  14 mg Transdermal Daily   Continuous Infusions:  sodium chloride 100 mL/hr at 11/15/18 0951   cefTRIAXone (ROCEPHIN)  IV 2 g (11/14/18 1705)   vancomycin 500 mg (11/15/18 0629)    LOS: 2 days   Merlene Laughter, DO [facility] PAGER is on AMION  If 7PM-7AM, please contact night-coverage [email or web address] Password [email or web address] 11/15/2018, 1:22 PM

## 2018-11-16 ENCOUNTER — Inpatient Hospital Stay (HOSPITAL_COMMUNITY): Payer: Medicaid - Out of State | Admitting: Anesthesiology

## 2018-11-16 ENCOUNTER — Encounter (HOSPITAL_COMMUNITY): Payer: Self-pay | Admitting: *Deleted

## 2018-11-16 ENCOUNTER — Encounter (HOSPITAL_COMMUNITY)
Admission: EM | Disposition: A | Discharge status: Home / Self Care | Payer: Self-pay | Source: Home / Self Care | Attending: Internal Medicine

## 2018-11-16 DIAGNOSIS — K0889 Other specified disorders of teeth and supporting structures: Secondary | ICD-10-CM

## 2018-11-16 HISTORY — PX: TOOTH EXTRACTION: SHX859

## 2018-11-16 HISTORY — PX: INCISION AND DRAINAGE ABSCESS: SHX5864

## 2018-11-16 LAB — CBC WITH DIFFERENTIAL/PLATELET
Abs Immature Granulocytes: 0.09 10*3/uL — ABNORMAL HIGH (ref 0.00–0.07)
Basophils Absolute: 0.1 10*3/uL (ref 0.0–0.1)
Basophils Relative: 1 %
Eosinophils Absolute: 0.4 10*3/uL (ref 0.0–0.5)
Eosinophils Relative: 3 %
HCT: 38.2 % (ref 36.0–46.0)
Hemoglobin: 12.3 g/dL (ref 12.0–15.0)
Immature Granulocytes: 1 %
Lymphocytes Relative: 25 %
Lymphs Abs: 3.4 10*3/uL (ref 0.7–4.0)
MCH: 29.6 pg (ref 26.0–34.0)
MCHC: 32.2 g/dL (ref 30.0–36.0)
MCV: 92 fL (ref 80.0–100.0)
Monocytes Absolute: 0.9 10*3/uL (ref 0.1–1.0)
Monocytes Relative: 7 %
Neutro Abs: 9 10*3/uL — ABNORMAL HIGH (ref 1.7–7.7)
Neutrophils Relative %: 63 %
Platelets: 351 10*3/uL (ref 150–400)
RBC: 4.15 MIL/uL (ref 3.87–5.11)
RDW: 14.9 % (ref 11.5–15.5)
WBC: 13.9 10*3/uL — ABNORMAL HIGH (ref 4.0–10.5)
nRBC: 0 % (ref 0.0–0.2)

## 2018-11-16 LAB — COMPREHENSIVE METABOLIC PANEL
ALT: 19 U/L (ref 0–44)
AST: 18 U/L (ref 15–41)
Albumin: 2.8 g/dL — ABNORMAL LOW (ref 3.5–5.0)
Alkaline Phosphatase: 87 U/L (ref 38–126)
Anion gap: 9 (ref 5–15)
BUN: 6 mg/dL (ref 6–20)
CO2: 21 mmol/L — ABNORMAL LOW (ref 22–32)
Calcium: 8.9 mg/dL (ref 8.9–10.3)
Chloride: 107 mmol/L (ref 98–111)
Creatinine, Ser: 0.36 mg/dL — ABNORMAL LOW (ref 0.44–1.00)
GFR calc Af Amer: >60 mL/min (ref >60)
GFR calc non Af Amer: >60 mL/min (ref >60)
Glucose, Bld: 125 mg/dL — ABNORMAL HIGH (ref 70–99)
Potassium: 3.8 mmol/L (ref 3.5–5.1)
Sodium: 137 mmol/L (ref 135–145)
Total Bilirubin: 0.9 mg/dL (ref 0.3–1.2)
Total Protein: 7.1 g/dL (ref 6.5–8.1)

## 2018-11-16 LAB — LACTIC ACID, PLASMA: Lactic Acid, Venous: 1 mmol/L (ref 0.5–1.9)

## 2018-11-16 LAB — VANCOMYCIN, PEAK: Vancomycin Pk: 16 ug/mL — ABNORMAL LOW (ref 30–40)

## 2018-11-16 LAB — MAGNESIUM: Magnesium: 2 mg/dL (ref 1.7–2.4)

## 2018-11-16 LAB — SURGICAL PCR SCREEN
MRSA, PCR: POSITIVE — AB
Staphylococcus aureus: POSITIVE — AB

## 2018-11-16 LAB — PROCALCITONIN: Procalcitonin: <0.1 ng/mL

## 2018-11-16 LAB — PHOSPHORUS: Phosphorus: 3.6 mg/dL (ref 2.5–4.6)

## 2018-11-16 SURGERY — INCISION AND DRAINAGE, ABSCESS
Anesthesia: General | Site: Mouth

## 2018-11-16 MED ORDER — ACETAMINOPHEN 500 MG PO TABS: 1000.0000 mg | ORAL_TABLET | Freq: Once | ORAL | Status: DC

## 2018-11-16 MED ORDER — FENTANYL CITRATE (PF) 100 MCG/2ML IJ SOLN
INTRAMUSCULAR | Status: DC | PRN
  Administered 2018-11-16 (×2): 100 ug via INTRAVENOUS

## 2018-11-16 MED ORDER — HYDROMORPHONE HCL 2 MG/ML IJ SOLN
INTRAMUSCULAR | Status: AC
  Filled 2018-11-16: qty 1

## 2018-11-16 MED ORDER — FENTANYL CITRATE (PF) 100 MCG/2ML IJ SOLN
INTRAMUSCULAR | Status: AC
  Filled 2018-11-16: qty 2

## 2018-11-16 MED ORDER — MIDAZOLAM HCL 5 MG/5ML IJ SOLN
INTRAMUSCULAR | Status: DC | PRN
  Administered 2018-11-16: 2 mg via INTRAVENOUS

## 2018-11-16 MED ORDER — OXYMETAZOLINE HCL 0.05 % NA SOLN
NASAL | Status: AC
  Filled 2018-11-16: qty 30

## 2018-11-16 MED ORDER — HYDROMORPHONE HCL 1 MG/ML IJ SOLN
INTRAMUSCULAR | Status: DC | PRN
  Administered 2018-11-16 (×2): 1 mg via INTRAVENOUS

## 2018-11-16 MED ORDER — LIDOCAINE-EPINEPHRINE 2 %-1:100000 IJ SOLN
INTRAMUSCULAR | Status: DC | PRN
  Administered 2018-11-16: 20 mL

## 2018-11-16 MED ORDER — LIDOCAINE-EPINEPHRINE 2 %-1:100000 IJ SOLN
INTRAMUSCULAR | Status: AC
  Filled 2018-11-16: qty 1

## 2018-11-16 MED ORDER — LIDOCAINE 2% (20 MG/ML) 5 ML SYRINGE
INTRAMUSCULAR | Status: DC | PRN
  Administered 2018-11-16: 60 mg via INTRAVENOUS

## 2018-11-16 MED ORDER — BUPIVACAINE-EPINEPHRINE (PF) 0.5% -1:200000 IJ SOLN
INTRAMUSCULAR | Status: AC
  Filled 2018-11-16: qty 5.4

## 2018-11-16 MED ORDER — PROPOFOL 10 MG/ML IV BOLUS
INTRAVENOUS | Status: DC | PRN
  Administered 2018-11-16: 200 mg via INTRAVENOUS

## 2018-11-16 MED ORDER — HYDROMORPHONE HCL 1 MG/ML IJ SOLN
0.2500 mg | INTRAMUSCULAR | Status: DC | PRN
  Administered 2018-11-16: 0.5 mg via INTRAVENOUS

## 2018-11-16 MED ORDER — HYDROMORPHONE HCL 1 MG/ML IJ SOLN
INTRAMUSCULAR | Status: AC
  Filled 2018-11-16: qty 1

## 2018-11-16 MED ORDER — LIDOCAINE 2% (20 MG/ML) 5 ML SYRINGE
INTRAMUSCULAR | Status: AC
  Filled 2018-11-16: qty 5

## 2018-11-16 MED ORDER — LIDOCAINE-EPINEPHRINE 2 %-1:100000 IJ SOLN
INTRAMUSCULAR | Status: AC
  Filled 2018-11-16: qty 5.1

## 2018-11-16 MED ORDER — DEXAMETHASONE SODIUM PHOSPHATE 10 MG/ML IJ SOLN
INTRAMUSCULAR | Status: AC
  Filled 2018-11-16: qty 1

## 2018-11-16 MED ORDER — ISOPROPYL ALCOHOL 70 % SOLN
Status: AC
  Filled 2018-11-16: qty 480

## 2018-11-16 MED ORDER — LACTATED RINGERS IV SOLN
INTRAVENOUS | Status: DC | PRN
  Administered 2018-11-16: 09:00:00 via INTRAVENOUS

## 2018-11-16 MED ORDER — KETAMINE HCL 50 MG/ML IJ SOLN
INTRAMUSCULAR | Status: DC | PRN
  Administered 2018-11-16: 40 mg via INTRAMUSCULAR

## 2018-11-16 MED ORDER — PROPOFOL 10 MG/ML IV BOLUS
INTRAVENOUS | Status: AC
  Filled 2018-11-16: qty 20

## 2018-11-16 MED ORDER — TRAZODONE HCL 50 MG PO TABS
50.0000 mg | ORAL_TABLET | Freq: Every evening | ORAL | Status: DC | PRN
  Administered 2018-11-16 – 2018-11-19 (×3): 50 mg via ORAL
  Filled 2018-11-16 (×2): qty 1

## 2018-11-16 MED ORDER — ONDANSETRON HCL 4 MG/2ML IJ SOLN
INTRAMUSCULAR | Status: AC
  Filled 2018-11-16: qty 2

## 2018-11-16 MED ORDER — DEXMEDETOMIDINE HCL IN NACL 200 MCG/50ML IV SOLN
INTRAVENOUS | Status: AC
  Filled 2018-11-16: qty 50

## 2018-11-16 MED ORDER — SUCCINYLCHOLINE CHLORIDE 200 MG/10ML IV SOSY
PREFILLED_SYRINGE | INTRAVENOUS | Status: AC
  Filled 2018-11-16: qty 10

## 2018-11-16 MED ORDER — DEXMEDETOMIDINE HCL IN NACL 200 MCG/50ML IV SOLN
INTRAVENOUS | Status: DC | PRN
  Administered 2018-11-16: 4 ug via INTRAVENOUS
  Administered 2018-11-16: 8 ug via INTRAVENOUS

## 2018-11-16 MED ORDER — TRAZODONE HCL 50 MG PO TABS
50.0000 mg | ORAL_TABLET | Freq: Once | ORAL | Status: AC
  Filled 2018-11-16: qty 1

## 2018-11-16 MED ORDER — KETAMINE HCL 10 MG/ML IJ SOLN
INTRAMUSCULAR | Status: AC
  Filled 2018-11-16: qty 1

## 2018-11-16 MED ORDER — MIDAZOLAM HCL 2 MG/2ML IJ SOLN
INTRAMUSCULAR | Status: AC
  Filled 2018-11-16: qty 2

## 2018-11-16 MED ORDER — SUCCINYLCHOLINE CHLORIDE 200 MG/10ML IV SOSY
PREFILLED_SYRINGE | INTRAVENOUS | Status: DC | PRN
  Administered 2018-11-16: 80 mg via INTRAVENOUS

## 2018-11-16 MED ORDER — LIDOCAINE-EPINEPHRINE 2 %-1:100000 IJ SOLN
INTRAMUSCULAR | Status: AC
  Filled 2018-11-16: qty 1.7

## 2018-11-16 MED ORDER — 0.9 % SODIUM CHLORIDE (POUR BTL) OPTIME
TOPICAL | Status: DC | PRN
  Administered 2018-11-16: 1000 mL

## 2018-11-16 SURGICAL SUPPLY — 28 items
ATTRACTOMAT 16X20 MAGNETIC DRP (DRAPES) ×1 IMPLANT
BAG ZIPLOCK 12X15 (MISCELLANEOUS) ×3 IMPLANT
BLADE SURG 15 STRL LF DISP TIS (BLADE) ×3 IMPLANT
BLADE SURG 15 STRL SS (BLADE) ×2
BNDG EYE OVAL (GAUZE/BANDAGES/DRESSINGS) ×4 IMPLANT
CANNULA VESSEL W/WING WO/VALVE (CANNULA) ×1 IMPLANT
COVER WAND RF STERILE (DRAPES) IMPLANT
DRAIN PENROSE 18X1/4 LTX STRL (WOUND CARE) ×2 IMPLANT
GAUZE 4X4 16PLY RFD (DISPOSABLE) ×3 IMPLANT
GAUZE SPONGE 4X4 12PLY STRL (GAUZE/BANDAGES/DRESSINGS) ×3 IMPLANT
GLOVE SURG ORTHO 8.0 STRL STRW (GLOVE) ×1 IMPLANT
GLOVE SURG SS PI 6.5 STRL IVOR (GLOVE) ×3 IMPLANT
KIT BASIN OR (CUSTOM PROCEDURE TRAY) ×3 IMPLANT
KIT TURNOVER KIT A (KITS) IMPLANT
NDL DENTAL RB 25GX1.25 (NEEDLE) IMPLANT
NEEDLE DENTAL RB 25GX1.25 (NEEDLE) IMPLANT
NS IRRIG 1000ML POUR BTL (IV SOLUTION) ×3 IMPLANT
PACK EENT SPLIT (PACKS) ×3 IMPLANT
PACKING VAGINAL (PACKING) ×3 IMPLANT
SUCTION FRAZIER HANDLE 12FR (TUBING) ×2
SUCTION TUBE FRAZIER 12FR DISP (TUBING) ×1 IMPLANT
SUT CHROMIC 3 0 PS 2 (SUTURE) ×5 IMPLANT
SUT CHROMIC 4 0 P 3 18 (SUTURE) IMPLANT
SYR 50ML LL SCALE MARK (SYRINGE) ×3 IMPLANT
TAPE CLOTH SURG 4X10 WHT LF (GAUZE/BANDAGES/DRESSINGS) ×2 IMPLANT
WATER STERILE IRR 1000ML POUR (IV SOLUTION) ×3 IMPLANT
YANKAUER SUCT BULB TIP 10FT TU (MISCELLANEOUS) ×3 IMPLANT
YANKAUER SUCT BULB TIP NO VENT (SUCTIONS) ×3 IMPLANT

## 2018-11-16 NOTE — Anesthesia Postprocedure Evaluation (Signed)
Anesthesia Post Note  Patient: Amanda Ray  Procedure(s) Performed: INCISION AND DRAINAGE ABSCESS cheek (N/A Mouth) DENTAL /EXTRACTIONS tooth 4 and 19 (N/A Mouth)     Patient location during evaluation: PACU Anesthesia Type: General Level of consciousness: awake and alert Pain management: pain level controlled Vital Signs Assessment: post-procedure vital signs reviewed and stable Respiratory status: spontaneous breathing, nonlabored ventilation and respiratory function stable Cardiovascular status: blood pressure returned to baseline and stable Postop Assessment: no apparent nausea or vomiting Anesthetic complications: no    Last Vitals:  Vitals:   11/16/18 1045 11/16/18 1100  BP: 122/84 123/83  Pulse: 71 74  Resp: 17 13  Temp:  36.6 C  SpO2: 99% 99%    Last Pain:  Vitals:   11/16/18 1100  TempSrc:   PainSc: Asleep                 Devaney Segers,W. EDMOND

## 2018-11-16 NOTE — H&P (Signed)
H&P documentation  -History and Physical Reviewed  -Patient has been re-examined  -No change in the plan of care  Amanda Ray  

## 2018-11-16 NOTE — Progress Notes (Signed)
Pharmacy Antibiotic Note  Amanda Ray is a 33 y.o. female admitted on 11/13/2018 with facial cellulitis.  Pharmacy has been consulted for vancomycin dosing.  Pt has PMH significant for IVDU and asthma. Pt presenting with right sided facial swelling and pain.  1/2 Blood cx + MRSA.    Today, 11/16/18  WBC trending down- 13.9  Renal function stable  TBW < IBW  Afebrile  S/p I&D of abscess today  Repeat Blood cx pending  Plan:  Continue Vancomycin 500 mg IV q12h  Follow renal function and culture data  Check vancomycin levels with next dose  Height: 5\' 5"  (165.1 cm) Weight: 100 lb (45.4 kg) IBW/kg (Calculated) : 57  Temp (24hrs), Avg:98 F (36.7 C), Min:97.5 F (36.4 C), Max:98.4 F (36.9 C)  Recent Labs  Lab 11/13/18 1540 11/13/18 1905 11/13/18 2233 11/14/18 0147 11/15/18 0429 11/16/18 0353  WBC 34.7*  --   --  27.7* 20.7* 13.9*  CREATININE 0.55  --   --  0.50 0.50 0.36*  LATICACIDVEN 2.1* 1.4 1.4 2.2*  --  1.0    Estimated Creatinine Clearance: 73 mL/min (A) (by C-G formula based on SCr of 0.36 mg/dL (L)).    No Known Allergies  Antimicrobials this admission: 10/8 CTX >>10/10 10/8 Vanc >>  Dose adjustments this admission:  Microbiology results: 10/8 BCx: + BCID 10/10 Gpc 1/4 meca+ mrsa 10/8 UCx: >100K ESBL Ecoli  -->colonization, ASB  10/8 SARS-2: Negative 10/10: Surgical screen: SA+, MRSA+ 10/11 BCx: IP  Thank you for allowing pharmacy to be a part of this patient's care.  Netta Cedars, PharmD, BCPS 11/16/2018 11:12 AM

## 2018-11-16 NOTE — Progress Notes (Signed)
Subjective: Right facial swelling has significantly improved. Now s/p I&D and dental extraction by OMFS.  Objective: Vital signs in last 24 hours: Temp:  [97.5 F (36.4 C)-98.8 F (37.1 C)] 97.5 F (36.4 C) (10/11 1451) Pulse Rate:  [52-112] 89 (10/11 1451) Resp:  [13-18] 16 (10/11 1451) BP: (111-139)/(77-93) 113/81 (10/11 1451) SpO2:  [95 %-100 %] 100 % (10/11 1319)  Physical Exam Vitals signsand nursing notereviewed.  General: She is not in acute distress. She is well-developed.  Head and face: Normocephalicand atraumatic. Right facial swelling has significantly improved. There is anterior cervical and submental adenopathy present. Eyes:Pupils are equal, round, reactive to light. Extraocular motion is intact.  Ears: Examination of the ears shows normal auricles and external auditory canals bilaterally. Both tympanic membranes are intact.  Nose: Nasal examination shows normal mucosa, septum, turbinates.  Mouth: Oral cavity examination shows no mucosal lacerations. No significant trismus is noted.Multiple dental carries. Pulmonary: Pulmonary effort is normal. Norespiratory distress.  Skin:Skin is warm.  Neurological: She is alert and oriented. Psychiatric: Behaviornormal.  Recent Labs    11/15/18 0429 11/16/18 0353  WBC 20.7* 13.9*  HGB 12.2 12.3  HCT 37.5 38.2  PLT 306 351   Recent Labs    11/15/18 0429 11/16/18 0353  NA 137 137  K 4.2 3.8  CL 105 107  CO2 22 21*  GLUCOSE 152* 125*  BUN 5* 6  CREATININE 0.50 0.36*  CALCIUM 9.0 8.9    Medications:  I have reviewed the patient's current medications. Scheduled: . cloNIDine  0.1 mg Oral QID   Followed by  . [START ON 11/17/2018] cloNIDine  0.1 mg Oral BH-qamhs   Followed by  . [START ON 11/19/2018] cloNIDine  0.1 mg Oral QAC breakfast  . enoxaparin (LOVENOX) injection  40 mg Subcutaneous Q24H  . HYDROmorphone      . nicotine  14 mg Transdermal Daily  . oxymetazoline       Continuous: . sodium  chloride 100 mL/hr at 11/16/18 1400  . vancomycin 500 mg (11/16/18 1824)    Assessment/Plan: Right facial cellulitis and facial/dental abscess. Facial swelling has significantly improved. On Vanc per ID. WBC has decreased.  - Now s/p I&D and dental extractions by Dr. Hoyt Koch. - Continue abx per ID. - Will sign off. Please call with questions or concerns.   LOS: 3 days   Kort Stettler W Medrith Veillon 11/16/2018, 7:18 PM

## 2018-11-16 NOTE — Op Note (Signed)
11/13/2018 - 11/16/2018  10:07 AM  PATIENT:  Zenovia Jordan  33 y.o. female  PRE-OPERATIVE DIAGNOSIS: Right facial cellulitis,  Non-restorable teeth # 4, 19  POST-OPERATIVE DIAGNOSIS:  SAME + Facial abscess right cheek  PROCEDURE:  Procedure(s): INCISION AND DRAINAGE ABSCESS cheek DENTAL /EXTRACTIONS tooth 4 and 19  SURGEON:  Surgeon(s): Diona Browner, DDS  ANESTHESIA:   local and general  EBL:  minimal  DRAINS: 1/4"penrose right cheek  CULTURES: aerobic, anaerobic, gram stain right cheek.  SPECIMEN:  No Specimen  COUNTS:  YES  PLAN OF CARE: Discharge to home after PACU  PATIENT DISPOSITION:  PACU - hemodynamically stable.   PROCEDURE DETAILS: Dictation # [id number]  Gae Bon, DMD 11/16/2018 10:07 AM

## 2018-11-16 NOTE — Plan of Care (Signed)
Patient lying in bed this morning; anticipating surgery later this morning. No needs expressed at this time.Will continue to monitor.

## 2018-11-16 NOTE — Anesthesia Procedure Notes (Signed)
Procedure Name: Intubation Date/Time: 11/16/2018 9:38 AM Performed by: [place] Alica Shellhammer C, CRNA Pre-anesthesia Checklist: Patient identified, Emergency Drugs available, Suction available and Patient being monitored Patient Re-evaluated:Patient Re-evaluated prior to induction Oxygen Delivery Method: Circle system utilized Preoxygenation: Pre-oxygenation with 100% oxygen Induction Type: IV induction and Rapid sequence Laryngoscope Size: Glidescope and 3 Grade View: Grade I Nasal Tubes: Left, Nasal prep performed and Nasal Rae Tube size: 6.5 mm Number of attempts: 1 Airway Equipment and Method: Stylet and Oral airway Placement Confirmation: ETT inserted through vocal cords under direct vision,  positive ETCO2 and breath sounds checked- equal and bilateral Tube secured with: Tape Dental Injury: Teeth and Oropharynx as per pre-operative assessment  Comments: Elective glidescope nasal intubation

## 2018-11-16 NOTE — Transfer of Care (Signed)
Immediate Anesthesia Transfer of Care Note  Patient: Amanda Ray  Procedure(s) Performed: INCISION AND DRAINAGE ABSCESS cheek (N/A Mouth) DENTAL /EXTRACTIONS tooth 4 and 19 (N/A Mouth)  Patient Location: PACU  Anesthesia Type:General  Level of Consciousness: awake, alert  and oriented  Airway & Oxygen Therapy: Patient Spontanous Breathing and Patient connected to face mask oxygen  Post-op Assessment: Report given to RN and Post -op Vital signs reviewed and stable  Post vital signs: Reviewed and stable  Last Vitals:  Vitals Value Taken Time  BP 139/79 11/16/18 1021  Temp    Pulse 109 11/16/18 1027  Resp 19 11/16/18 1027  SpO2 99 % 11/16/18 1027  Vitals shown include unvalidated device data.  Last Pain:  Vitals:   11/16/18 0823  TempSrc:   PainSc: 4       Patients Stated Pain Goal: 2 (00/93/81 8299)  Complications: No apparent anesthesia complications

## 2018-11-16 NOTE — Progress Notes (Signed)
[facility]                                                                              Patient Demographics  Amanda Ray, is a 33 y.o. female, DOB - 08-Sep-1985, ZYS:[id number]  Admit date - 11/13/2018   Admitting Physician Orene Desanctis, DO  Outpatient Primary MD for the patient is Patient, No Pcp Per  Outpatient specialists:   LOS - 3  days   Medical records reviewed and are as summarized below:    Chief Complaint  Patient presents with   Facial Swelling       Brief summary  Amanda Ray a 33 y.o.femalewith medical history significant ofasthma and polysubstance abuse who presented with severe right facial cellulitis.  Patient also reported having issues with the upper right tooth, that is chipped and did not seek medical care.  She also reported IV heroin on the morning of admission and her usual injection sites are either side of the neck. CT maxillofacial showed severe right maxillofacial area cellulitis extending to the right periorbital, right peri-mandible, right upper neck soft tissue.  No evidence of post septal orbital extension.  2.8 cm peripheral enhancement in the right cheek soft tissue may represent early abscess formation, asymmetric prominence of the right parotid gland may reflect parotiditis  Patient has been seen by ophthalmology, ENT, oral dental surgery.  Plan for or today by oral surgery, Dr. Hoyt Koch.  Assessment & Plan    Principal problem Sepsis/MRSA bacteremia, secondary to right maxillofacial cellulitis, periorbital involvement, tooth abscess, possible right parotiditis -CT maxillofacial showed severe right maxilla facial cellulitis extending to right periorbital, right peri-mandible and right upper soft tissue neck, 2.6 cm abscess formation -Maintaining airway, continue IV vancomycin and Rocephin -Patient seen by ophthalmology, recommended continue antibiotics, no evidence of post septal involvement and no orbital signs.  Reconsult  if new changes or worsening in vision or orbital signs such as chemosis, APD, diplopia or drop in vision -Evaluated by ENT Dr. Benjamine Mola, recommended continue IV antibiotics per ID -Consulted dentistry, oral surgery, appreciate Dr. 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, blood cultures 1+ with MRSA, high risk of endocarditis -2D echo showed EF of 60-65%, no obvious vegetations, normal LV function -will follow ID recommendations if TEE is needed  Lactic acidosis -Likely due to sepsis, continue IV fluid hydration  History of asthma Currently stable, no acute wheezing, continue nebs as needed  Polysubstance abuse, history of ongoing IVDU -Last heroin use IV on the morning of admission, injecting in her neck, smoking and marijuana use -Patient placed on clonidine detox protocol -Follow closely for opiate withdrawals  Nicotine abuse Continue nicotine patch, counseled on smoking cessation   Mild hyponatremia -Improved, continue gentle hydration  ESBL E. coli urine colonization -UA with negative leukocytes, negative nitrites, culture showed > 100,000 colonies of ESBL E. Coli -ID following, patient is currently asymptomatic   Protein calorie malnutrition, moderate Likely due to acute illness, BMI 16.6   Code Status: Full CODE STATUS DVT Prophylaxis:  Lovenox  Family Communication: Discussed all imaging results, lab results, explained to the  patient   Disposition Plan: Remains inpatient, OR today  Time Spent in minutes 35 minutes  Procedures:  2D echo  Consultants:    ENT Dr. Suszanne Conners  Opthamololgy Dr. Sherryll Burger  Dentistry Dr. Kristin Bruins  Oral Surgery Dr. Barbette Merino   ID Dr.:  Antimicrobials:   Anti-infectives (From admission, onward)   Start     Dose/Rate Route Frequency Ordered Stop   11/14/18 1800  cefTRIAXone (ROCEPHIN) 2 g in sodium chloride 0.9 % 100 mL IVPB  Status:  Discontinued     2 g 200  mL/hr over 30 Minutes Intravenous Every 24 hours 11/14/18 0013 11/15/18 1326   11/14/18 0600  [MAR Hold]  vancomycin (VANCOCIN) 500 mg in sodium chloride 0.9 % 100 mL IVPB     (MAR Hold since Sun 11/16/2018 at 0950.Hold Reason: Transfer to a Procedural area.)   500 mg 100 mL/hr over 60 Minutes Intravenous Every 12 hours 11/13/18 2112     11/13/18 1600  vancomycin (VANCOCIN) IVPB 1000 mg/200 mL premix     1,000 mg 200 mL/hr over 60 Minutes Intravenous  Once 11/13/18 1557 11/13/18 1829   11/13/18 1600  cefTRIAXone (ROCEPHIN) 2 g in sodium chloride 0.9 % 100 mL IVPB     2 g 200 mL/hr over 30 Minutes Intravenous  Once 11/13/18 1557 11/13/18 2023          Medications  Scheduled Meds:  acetaminophen  1,000 mg Oral Once   Chlorhexidine Gluconate Cloth  6 each Topical Once   [MAR Hold] cloNIDine  0.1 mg Oral QID   Followed by   [ZOX Hold] cloNIDine  0.1 mg Oral BH-qamhs   Followed by   [facility] Hold] cloNIDine  0.1 mg Oral QAC breakfast   [MAR Hold] enoxaparin (LOVENOX) injection  40 mg Subcutaneous Q24H   HYDROmorphone       [MAR Hold] nicotine  14 mg Transdermal Daily   oxymetazoline       Continuous Infusions:  sodium chloride 100 mL/hr at 11/15/18 2134   [facility] Hold] vancomycin 500 mg (11/16/18 0528)   PRN Meds:.[MAR Hold] acetaminophen **OR** [MAR Hold] acetaminophen, [MAR Hold] dicyclomine, HYDROmorphone (DILAUDID) injection, [MAR Hold] hydrOXYzine, [MAR Hold] loperamide, [MAR Hold] methocarbamol, [MAR Hold]  morphine injection, [MAR Hold] naproxen, [MAR Hold] ondansetron **OR** [MAR Hold] ondansetron (ZOFRAN) IV, [MAR Hold] ondansetron, [MAR Hold] traMADol      Subjective:   Amanda Ray was seen and examined today.  Facial cellulitis, states still hurting on her face.  No fevers this morning.  No chest pain or shortness of breath.  No abdominal pain, nausea or vomiting.  Objective:   Vitals:   11/16/18 1022 11/16/18 1030 11/16/18 1045 11/16/18 1100  BP: 139/79  (!) 130/93 122/84 123/83  Pulse: (!) 112 99 71 74  Resp: Temp: (!) 97.5 F (36.4 C)   97.9 F (36.6 C)  TempSrc:      SpO2: 100% 100% 99% 99%  Weight:      Height:        Intake/Output Summary (Last 24 hours) at 11/16/2018 1105 Last data filed at 11/16/2018 1028 Gross per 24 hour  Intake 3524.79 ml  Output 3250 ml  Net 274.79 ml     Wt Readings from Last 3 Encounters:  11/13/18 45.4 kg     Exam  General: Alert and oriented x 3, NAD  Eyes: Cellulitic right peri-orbital area  HEENT: Swollen on the right, right facial cellulitis with indurated area on the cheek  Cardiovascular:  S1 S2 auscultated, no murmurs, RRR  Respiratory: Clear to auscultation bilaterally, no wheezing, rales or rhonchi  Gastrointestinal: Soft, nontender, nondistended, + bowel sounds  Ext: no pedal edema bilaterally  Neuro no new deficits  Musculoskeletal: No digital cyanosis, clubbing  Skin: Right facial cellulitis, indurated, warm to touch, tender  Psych: Normal affect and demeanor, alert and oriented x3    Data Reviewed:  I have personally reviewed following labs and imaging studies  Micro Results Recent Results (from the past 240 hour(s))  Blood Culture (routine x 2)     Status: Abnormal (Preliminary result)   Collection Time: 11/13/18  4:08 PM   Specimen: BLOOD  Result Value Ref Range Status   Specimen Description   Final    BLOOD LEFT ANTECUBITAL Performed at [facility], [address] [place], [place] [address]    Special Requests   Final    BOTTLES DRAWN AEROBIC AND ANAEROBIC Blood Culture results may not be optimal due to an excessive volume of blood received in culture bottles Performed at [facility], [address], [place], [place] [address]    Culture  Setup Time   Final    GRAM POSITIVE COCCI IN CLUSTERS ANAEROBIC BOTTLE ONLY Organism ID to follow CRITICAL RESULT CALLED TO, READ BACK BY AND VERIFIED WITHTrixie Deis, AT 0981 11/15/18 BY D.VANHOOK Performed at [facility], [address], [place], [place] [address]    Culture STAPHYLOCOCCUS AUREUS (A)  Final   Report Status PENDING  Incomplete  Blood Culture ID Panel (Reflexed)     Status: Abnormal   Collection Time: 11/13/18  4:08 PM  Result Value Ref Range Status   Enterococcus species NOT DETECTED NOT DETECTED Final   Listeria monocytogenes NOT DETECTED NOT DETECTED Final   Staphylococcus species DETECTED (A) NOT DETECTED Final    Comment: CRITICAL RESULT CALLED TO, READ BACK BY AND VERIFIED WITH: Peggyann Juba PHARMD, AT 8295 11/15/18 BY D. VANHOOK    Staphylococcus aureus (BCID) DETECTED (A) NOT DETECTED Final    Comment: Methicillin (oxacillin)-resistant Staphylococcus aureus (MRSA). MRSA is predictably resistant to beta-lactam antibiotics (except ceftaroline). Preferred therapy is vancomycin unless clinically contraindicated. Patient requires contact precautions if  hospitalized. CRITICAL RESULT CALLED TO, READ BACK BY AND VERIFIED WITH: Peggyann Juba PHARMD, AT 6213 11/15/18 BY D. VANHOOK    Methicillin resistance DETECTED (A) NOT DETECTED Final    Comment: CRITICAL RESULT CALLED TO, READ BACK BY AND VERIFIED WITH: Peggyann Juba PHARMD, AT 0865 11/15/18 BY D. VANHOOK    Streptococcus species NOT DETECTED NOT DETECTED Final   Streptococcus agalactiae NOT DETECTED NOT DETECTED Final   Streptococcus pneumoniae NOT DETECTED NOT DETECTED Final   Streptococcus pyogenes NOT DETECTED NOT DETECTED Final   Acinetobacter baumannii NOT DETECTED NOT DETECTED Final   Enterobacteriaceae species NOT DETECTED NOT DETECTED Final   Enterobacter cloacae complex NOT DETECTED NOT DETECTED Final   Escherichia coli NOT DETECTED NOT DETECTED Final   Klebsiella oxytoca NOT DETECTED NOT DETECTED Final   Klebsiella pneumoniae NOT DETECTED NOT DETECTED Final   Proteus species NOT DETECTED NOT DETECTED Final   Serratia marcescens NOT DETECTED NOT  DETECTED Final   Haemophilus influenzae NOT DETECTED NOT DETECTED Final   Neisseria meningitidis NOT DETECTED NOT DETECTED Final   Pseudomonas aeruginosa NOT DETECTED NOT DETECTED Final   Candida albicans NOT DETECTED NOT DETECTED Final   Candida glabrata NOT DETECTED NOT DETECTED Final   Candida krusei NOT DETECTED NOT DETECTED  Final   Candida parapsilosis NOT DETECTED NOT DETECTED Final   Candida tropicalis NOT DETECTED NOT DETECTED Final    Comment: Performed at [facility], [address], [place], [place] [address]  Urine culture     Status: Abnormal   Collection Time: 11/13/18  4:11 PM   Specimen: In/Out Cath Urine  Result Value Ref Range Status   Specimen Description   Final    IN/OUT CATH URINE Performed at [facility], [address] [place], [place] [address]    Special Requests   Final    NONE Performed at [facility], [address], [place], [place] [address]    Culture (A)  Final    >=100,000 COLONIES/mL ESCHERICHIA COLI Confirmed Extended Spectrum Beta-Lactamase Producer (ESBL).  In bloodstream infections from ESBL organisms, carbapenems are preferred over piperacillin/tazobactam. They are shown to have a lower risk of mortality. 10,000 COLONIES/mL GROUP B STREP(S.AGALACTIAE)ISOLATED TESTING AGAINST S. AGALACTIAE NOT ROUTINELY PERFORMED DUE TO PREDICTABILITY OF AMP/PEN/VAN SUSCEPTIBILITY. Performed at [facility], [address], [place], [place] [address]    Report Status 11/15/2018 FINAL  Final   Organism ID, Bacteria ESCHERICHIA COLI (A)  Final      Susceptibility   Escherichia coli - MIC*    AMPICILLIN >=32 RESISTANT Resistant     CEFAZOLIN >=64 RESISTANT Resistant     CEFTRIAXONE >=64 RESISTANT Resistant     CIPROFLOXACIN <=0.25 SENSITIVE Sensitive     GENTAMICIN <=1 SENSITIVE Sensitive     IMIPENEM <=0.25 SENSITIVE Sensitive     NITROFURANTOIN 32 SENSITIVE Sensitive     TRIMETH/SULFA >=320 RESISTANT  Resistant     AMPICILLIN/SULBACTAM 8 SENSITIVE Sensitive     PIP/TAZO <=4 SENSITIVE Sensitive     Extended ESBL POSITIVE Resistant     * >=100,000 COLONIES/mL ESCHERICHIA COLI  Blood Culture (routine x 2)     Status: None (Preliminary result)   Collection Time: 11/13/18  4:36 PM   Specimen: BLOOD RIGHT FOREARM  Result Value Ref Range Status   Specimen Description   Final    BLOOD RIGHT FOREARM Performed at [facility], [address], [place], [place] [address]    Special Requests   Final    BOTTLES DRAWN AEROBIC ONLY Blood Culture results may not be optimal due to an inadequate volume of blood received in culture bottles Performed at [facility], [address], [place], [place] [address]    Culture   Final    NO GROWTH 2 DAYS Performed at [facility], [address], [place], [place] [address]    Report Status PENDING  Incomplete  SARS Coronavirus 2 by RT PCR (hospital order, performed in [facility] hospital lab) Nasopharyngeal Nasopharyngeal Swab     Status: None   Collection Time: 11/13/18  6:44 PM   Specimen: Nasopharyngeal Swab  Result Value Ref Range Status   SARS Coronavirus 2 NEGATIVE NEGATIVE Final    Comment: (NOTE) If result is NEGATIVE SARS-CoV-2 target nucleic acids are NOT DETECTED. The SARS-CoV-2 RNA is generally detectable in upper and lower  respiratory specimens during the acute phase of infection. The lowest  concentration of SARS-CoV-2 viral copies this assay can detect is 250  copies / mL. A negative result does not preclude SARS-CoV-2 infection  and should not be used as the sole basis for treatment or other  patient management decisions.  A negative result may occur with  improper specimen collection /  handling, submission of specimen other  than nasopharyngeal swab, presence of viral mutation(s) within the  areas targeted by this assay, and inadequate number of viral copies  (<250 copies / mL). A negative result must  be combined with clinical  observations, patient history, and epidemiological information. If result is POSITIVE SARS-CoV-2 target nucleic acids are DETECTED. The SARS-CoV-2 RNA is generally detectable in upper and lower  respiratory specimens dur ing the acute phase of infection.  Positive  results are indicative of active infection with SARS-CoV-2.  Clinical  correlation with patient history and other diagnostic information is  necessary to determine patient infection status.  Positive results do  not rule out bacterial infection or co-infection with other viruses. If result is PRESUMPTIVE POSTIVE SARS-CoV-2 nucleic acids MAY BE PRESENT.   A presumptive positive result was obtained on the submitted specimen  and confirmed on repeat testing.  While 2019 novel coronavirus  (SARS-CoV-2) nucleic acids may be present in the submitted sample  additional confirmatory testing may be necessary for epidemiological  and / or clinical management purposes  to differentiate between  SARS-CoV-2 and other Sarbecovirus currently known to infect humans.  If clinically indicated additional testing with an alternate test  methodology [phone]) is advised. The SARS-CoV-2 RNA is generally  detectable in upper and lower respiratory sp ecimens during the acute  phase of infection. The expected result is Negative. Fact Sheet for Patients:  BoilerBrush.com.cy Fact Sheet for Healthcare Providers: https://pope.com/ This test is not yet approved or cleared by the [place] FDA and has been authorized for detection and/or diagnosis of SARS-CoV-2 by FDA under an Emergency Use Authorization (EUA).  This EUA will remain in effect (meaning this test can be used) for the duration of the COVID-19 declaration under Section 564(b)(1) of the Act, 21 U.S.C. section 360bbb-3(b)(1), unless the authorization is terminated or revoked sooner. Performed at [facility], [address], [place], [place] [address]   Surgical pcr screen     Status: Abnormal   Collection Time: 11/15/18  8:25 PM   Specimen: Nasal Mucosa; Nasal Swab  Result Value Ref Range Status   MRSA, PCR POSITIVE (A) NEGATIVE Final    Comment: RESULT CALLED TO, READ BACK BY AND VERIFIED WITH: A PEREZ,RN 11/16/18 0518 RHOLMES    Staphylococcus aureus POSITIVE (A) NEGATIVE Final    Comment: (NOTE) The Xpert SA Assay (FDA approved for NASAL specimens in patients 59 years of age and older), is one component of a comprehensive surveillance program. It is not intended to diagnose infection nor to guide or monitor treatment. Performed at [facility], [address] [place], [place] [address]     Radiology Reports Ct Maxillofacial W Contrast  Result Date: 11/13/2018 CLINICAL DATA:  Cellulitis of face. Additional history provided: Patient reports temple to right side of face since yesterday, swelling started last night. EXAM: CT MAXILLOFACIAL WITH CONTRAST TECHNIQUE: Multidetector CT imaging of the maxillofacial structures was performed with intravenous contrast. Multiplanar CT image reconstructions were also generated. CONTRAST:  63mL OMNIPAQUE IOHEXOL 300 MG/ML  SOLN COMPARISON:  No pertinent prior studies available for comparison. FINDINGS: Osseous: There are multiple carious teeth. Subtle periapical lucency surrounding the left lower second premolar and first molar. Orbits: Right periorbital cellulitis. No definite evidence of postseptal extension on the current exam. Sinuses: No significant paranasal sinus disease or mastoid effusion. Soft tissues: There is severe right maxillofacial soft tissue swelling and stranding consistent with cellulitis which extends to the right periorbital  region. There is a subtle circumscribed low-attenuation region with peripheral enhancement in the right cheek soft tissues measuring 2.8 x 1.0 x 1.2 cm (AP x TV x CC) (series  3, image 60) (series 7, image 30). This may reflect very early abscess formation and extends anteriorly toward the right cheek skin surface (series 3, image 65). Cellulitis changes also extend to the right perimandibular region and upper right neck. Asymmetric prominence of the right parotid gland may reflect a reactive parotiditis. No appreciable mass or swelling within the nasopharynx, oral cavity, oropharynx, hypopharynx or larynx. Enlarged right cervical chain lymph nodes, likely reactive. An index right level II lymph node measures 16 mm in short axis Limited intracranial: Unremarkable IMPRESSION: 1. Severe right maxillofacial cellulitis extending to the right periorbital, right perimandibular and right upper neck soft tissues. No evidence of postseptal orbital extension on the current examination. 2. 2.8 cm subtle region of low attenuation with peripheral enhancement in the right cheek soft tissues, as described and which may reflect very early abscess formation. 3. Asymmetric prominence of the right parotid gland, which may reflect reactive parotiditis. Clinical correlation is recommended. 4. Right upper cervical lymphadenopathy, likely reactive. Electronically Signed   By: Jackey Loge   On: 11/13/2018 17:51    Lab Data:  CBC: Recent Labs  Lab 11/13/18 1540 11/14/18 0147 11/15/18 0429 11/16/18 0353  WBC 34.7* 27.7* 20.7* 13.9*  NEUTROABS 28.9*  --  15.2* 9.0*  HGB 15.2* 13.9 12.2 12.3  HCT 46.5* 42.4 37.5 38.2  MCV 89.6 89.8 90.4 92.0  PLT 343 323 306 351   Basic Metabolic Panel: Recent Labs  Lab 11/13/18 1540 11/14/18 0147 11/15/18 0429 11/16/18 0353  NA 132* 134* 137 137  K 3.8 3.7 4.2 3.8  CL 94* 96* 105 107  CO2 21*  GLUCOSE 156* 162* 152* 125*  BUN 7 9 5* 6  CREATININE 0.55 0.50 0.50 0.36*  CALCIUM 9.7 9.3 9.0 8.9  MG  --   --  2.3 2.0  PHOS  --   --  2.5 3.6   GFR: Estimated Creatinine Clearance: 73 mL/min (A) (by C-G formula based on SCr of 0.36 mg/dL  (L)). Liver Function Tests: Recent Labs  Lab 11/13/18 1540 11/15/18 0429 11/16/18 0353  AST ALT ALKPHOS 104 97 87  BILITOT 0.6 0.5 0.9  PROT 9.0* 7.4 7.1  ALBUMIN 4.3 3.3* 2.8*   No results for input(s): LIPASE, AMYLASE in the last 168 hours. No results for input(s): AMMONIA in the last 168 hours. Coagulation Profile: No results for input(s): INR, PROTIME in the last 168 hours. Cardiac Enzymes: No results for input(s): CKTOTAL, CKMB, CKMBINDEX, TROPONINI in the last 168 hours. BNP (last 3 results) No results for input(s): PROBNP in the last 8760 hours. HbA1C: No results for input(s): HGBA1C in the last 72 hours. CBG: No results for input(s): GLUCAP in the last 168 hours. Lipid Profile: No results for input(s): CHOL, HDL, LDLCALC, TRIG, CHOLHDL, LDLDIRECT in the last 72 hours. Thyroid Function Tests: No results for input(s): TSH, T4TOTAL, FREET4, T3FREE, THYROIDAB in the last 72 hours. Anemia Panel: No results for input(s): VITAMINB12, FOLATE, FERRITIN, TIBC, IRON, RETICCTPCT in the last 72 hours. Urine analysis:    Component Value Date/Time   COLORURINE YELLOW 11/13/2018 1540   APPEARANCEUR CLEAR 11/13/2018 1540   LABSPEC 1.015 11/13/2018 1540   PHURINE 7.0 11/13/2018 1540   GLUCOSEU NEGATIVE 11/13/2018 1540   HGBUR SMALL (A)  11/13/2018 1540   BILIRUBINUR NEGATIVE 11/13/2018 1540   KETONESUR NEGATIVE 11/13/2018 1540   PROTEINUR NEGATIVE 11/13/2018 1540   NITRITE NEGATIVE 11/13/2018 1540   LEUKOCYTESUR NEGATIVE 11/13/2018 1540     Wing Schoch M.D. [facility] 11/16/2018, 11:05 AM  Pager: [phone] Between 7am to 7pm - call Pager - [phone]  After 7pm go to [email or web address] - password [email or web address]  Call night coverage person covering after 7pm

## 2018-11-17 ENCOUNTER — Encounter (HOSPITAL_COMMUNITY): Payer: Self-pay | Admitting: Oral Surgery

## 2018-11-17 DIAGNOSIS — B9561 Methicillin susceptible Staphylococcus aureus infection as the cause of diseases classified elsewhere: Secondary | ICD-10-CM

## 2018-11-17 DIAGNOSIS — L0201 Cutaneous abscess of face: Secondary | ICD-10-CM

## 2018-11-17 DIAGNOSIS — R8271 Bacteriuria: Secondary | ICD-10-CM

## 2018-11-17 LAB — BASIC METABOLIC PANEL
Anion gap: 10 (ref 5–15)
BUN: 5 mg/dL — ABNORMAL LOW (ref 6–20)
CO2: 23 mmol/L (ref 22–32)
Calcium: 9 mg/dL (ref 8.9–10.3)
Chloride: 108 mmol/L (ref 98–111)
Creatinine, Ser: 0.62 mg/dL (ref 0.44–1.00)
GFR calc Af Amer: >60 mL/min (ref >60)
GFR calc non Af Amer: >60 mL/min (ref >60)
Glucose, Bld: 134 mg/dL — ABNORMAL HIGH (ref 70–99)
Potassium: 4 mmol/L (ref 3.5–5.1)
Sodium: 141 mmol/L (ref 135–145)

## 2018-11-17 LAB — CBC
HCT: 37.6 % (ref 36.0–46.0)
Hemoglobin: 12.1 g/dL (ref 12.0–15.0)
MCH: 29.2 pg (ref 26.0–34.0)
MCHC: 32.2 g/dL (ref 30.0–36.0)
MCV: 90.6 fL (ref 80.0–100.0)
Platelets: 414 10*3/uL — ABNORMAL HIGH (ref 150–400)
RBC: 4.15 MIL/uL (ref 3.87–5.11)
RDW: 14.6 % (ref 11.5–15.5)
WBC: 14.2 10*3/uL — ABNORMAL HIGH (ref 4.0–10.5)
nRBC: 0 % (ref 0.0–0.2)

## 2018-11-17 LAB — CULTURE, BLOOD (ROUTINE X 2)

## 2018-11-17 LAB — VANCOMYCIN, TROUGH: Vancomycin Tr: 4 ug/mL — ABNORMAL LOW (ref 15–20)

## 2018-11-17 LAB — HEMOGLOBIN A1C
Hgb A1c MFr Bld: 6.1 % — ABNORMAL HIGH (ref 4.8–5.6)
Mean Plasma Glucose: 128.37 mg/dL

## 2018-11-17 MED ORDER — PRO-STAT SUGAR FREE PO LIQD
30.0000 mL | Freq: Two times a day (BID) | ORAL | Status: DC
  Administered 2018-11-17 – 2018-11-20 (×5): 30 mL via ORAL
  Filled 2018-11-17 (×6): qty 30

## 2018-11-17 MED ORDER — VANCOMYCIN HCL IN DEXTROSE 750-5 MG/150ML-% IV SOLN
750.0000 mg | Freq: Three times a day (TID) | INTRAVENOUS | Status: DC
  Administered 2018-11-17 – 2018-11-20 (×9): 750 mg via INTRAVENOUS
  Filled 2018-11-17 (×12): qty 150

## 2018-11-17 NOTE — Progress Notes (Addendum)
[facility]                                                                              Patient Demographics  Amanda Ray, is a 33 y.o. female, DOB - 02/08/1985, ZOX:[id number]  Admit date - 11/13/2018   Admitting Physician Anselm Junglinghing T Tu, DO  Outpatient Primary MD for the patient is Patient, No Pcp Per  Outpatient specialists:   LOS - 4  days   Medical records reviewed and are as summarized below:    Chief Complaint  Patient presents with   Facial Swelling       Brief summary  Amanda Peonrin Hickeyis a 33 y.o.femalewith medical history significant ofasthma and polysubstance abuse who presented with severe right facial cellulitis.  Patient also reported having issues with the upper right tooth, that is chipped and did not seek medical care.  She also reported IV heroin on the morning of admission and her usual injection sites are either side of the neck. CT maxillofacial showed severe right maxillofacial area cellulitis extending to the right periorbital, right peri-mandible, right upper neck soft tissue.  No evidence of post septal orbital extension.  2.8 cm peripheral enhancement in the right cheek soft tissue may represent early abscess formation, asymmetric prominence of the right parotid gland may reflect parotiditis  Patient has been seen by ophthalmology, ENT, oral dental surgery.  Plan for or today by oral surgery, Dr. Barbette MerinoJensen.  Assessment & Plan    Principal problem Sepsis/MRSA bacteremia, secondary to right maxillofacial cellulitis, periorbital involvement, tooth abscess, possible right parotiditis -CT maxillofacial showed severe right maxilla facial cellulitis extending to right periorbital, right peri-mandible and right upper soft tissue neck, 2.6 cm abscess formation -Maintaining airway, continue IV vancomycin and Rocephin -Patient seen by ophthalmology, recommended continue antibiotics, no evidence of post septal involvement and no orbital signs.  Reconsult  if new changes or worsening in vision or orbital signs such as chemosis, APD, diplopia or drop in vision -Evaluated by ENT Dr. Suszanne Connerseoh, recommended continue IV antibiotics per ID -Status post I&D and extraction by OMFS - right facial cellulitis, swelling improving today -Advance diet to soft solids  MRSA bacteremia -History of IVDU injecting into her neck, blood cultures 1+ with MRSA, high risk of endocarditis -2D echo showed EF of 60-65%, no obvious vegetations, normal LV function -will follow ID recommendations if TEE is needed Addendum:  Reviewed ID recommendations, contacted cardiology, plan for TEE on Wednesday 10/14, NPO after MN on 10/13  Lactic acidosis -Likely due to sepsis, improving, advance diet, DC IV fluids.  History of asthma Currently stable, no acute wheezing, continue nebs as needed  Polysubstance abuse, history of ongoing IVDU -Last heroin use IV on the morning of admission, injecting in her neck, smoking and marijuana use -Patient placed on clonidine detox protocol -Currently not in any opioid withdrawals   Nicotine abuse Continue nicotine patch, counseled on smoking cessation  Mild hyponatremia -Improved, continue gentle hydration  ESBL E. coli urine colonization -UA with negative leukocytes, negative nitrites, culture showed > 100,000 colonies of ESBL E. Coli -ID following, patient is currently asymptomatic   Protein calorie malnutrition, moderate Likely due  to acute illness, BMI 16.6 Nutrition consult, advance diet - added prostat   Code Status: Full CODE STATUS DVT Prophylaxis:  Lovenox  Family Communication: Discussed all imaging results, lab results, explained to the patient and patient's boyfriend at the bedside   Disposition Plan: Currently on IV antibiotics, not ready for discharge  Time Spent in minutes 35 minutes  Procedures:  2D echo  Consultants:    ENT Dr. Suszanne Conners  Opthamololgy Dr. Sherryll Burger  Dentistry Dr. Kristin Bruins  Oral Surgery Dr.  Barbette Merino   ID Dr.:  Antimicrobials:   Anti-infectives (From admission, onward)   Start     Dose/Rate Route Frequency Ordered Stop   11/17/18 0600  vancomycin (VANCOCIN) IVPB 750 mg/150 ml premix     750 mg 150 mL/hr over 60 Minutes Intravenous Every 8 hours 11/17/18 0554     11/14/18 1800  cefTRIAXone (ROCEPHIN) 2 g in sodium chloride 0.9 % 100 mL IVPB  Status:  Discontinued     2 g 200 mL/hr over 30 Minutes Intravenous Every 24 hours 11/14/18 0013 11/15/18 1326   11/14/18 0600  vancomycin (VANCOCIN) 500 mg in sodium chloride 0.9 % 100 mL IVPB  Status:  Discontinued     500 mg 100 mL/hr over 60 Minutes Intravenous Every 12 hours 11/13/18 2112 11/17/18 0553   11/13/18 1600  vancomycin (VANCOCIN) IVPB 1000 mg/200 mL premix     1,000 mg 200 mL/hr over 60 Minutes Intravenous  Once 11/13/18 1557 11/13/18 1829   11/13/18 1600  cefTRIAXone (ROCEPHIN) 2 g in sodium chloride 0.9 % 100 mL IVPB     2 g 200 mL/hr over 30 Minutes Intravenous  Once 11/13/18 1557 11/13/18 2023         Medications  Scheduled Meds:  cloNIDine  0.1 mg Oral BH-qamhs   Followed by   Melene Muller ON 11/19/2018] cloNIDine  0.1 mg Oral QAC breakfast   enoxaparin (LOVENOX) injection  40 mg Subcutaneous Q24H   nicotine  14 mg Transdermal Daily   Continuous Infusions:  sodium chloride 100 mL/hr at 11/17/18 0916   vancomycin 750 mg (11/17/18 1344)   PRN Meds:.acetaminophen **OR** acetaminophen, dicyclomine, hydrOXYzine, loperamide, methocarbamol, morphine injection, naproxen, ondansetron **OR** ondansetron (ZOFRAN) IV, ondansetron, traMADol, traZODone      Subjective:   Amanda Ray was seen and examined today.  Facial cellulitis improving, states swelling is going down.  Pain is still there but improving.  No Fevers.  No chest pain or shortness of breath.  No abdominal pain, nausea or vomiting.  Objective:   Vitals:   11/16/18 1451 11/16/18 2156 11/17/18 0557 11/17/18 0912  BP: 113/81 109/74 109/69 123/83    Pulse: 89 73 62   Resp: Temp: (!) 97.5 F (36.4 C) 98.1 F (36.7 C) 98.4 F (36.9 C)   TempSrc: Oral Oral Oral   SpO2:  100% 97%   Weight:      Height:        Intake/Output Summary (Last 24 hours) at 11/17/2018 1526 Last data filed at 11/17/2018 1358 Gross per 24 hour  Intake 4007.37 ml  Output 3000 ml  Net 1007.37 ml     Wt Readings from Last 3 Encounters:  11/13/18 45.4 kg    Physical Exam  General: Alert and oriented x 3, NAD  Eyes: right peri orbital cellulitis improving   HEENT:  Right face dressing intact but swelling visibly improving   Cardiovascular: S1 S2 clear, no murmurs, RRR. No pedal edema b/l  Respiratory: CTAB,  no wheezing, rales or rhonchi  Gastrointestinal: Soft, nontender, nondistended, NBS  Ext: no pedal edema bilaterally  Neuro: no new deficits  Musculoskeletal: No cyanosis, clubbing  Skin: No rashes  Psych: Normal affect and demeanor, alert and oriented x3     Data Reviewed:  I have personally reviewed following labs and imaging studies  Micro Results Recent Results (from the past 240 hour(s))  Blood Culture (routine x 2)     Status: Abnormal   Collection Time: 11/13/18  4:08 PM   Specimen: BLOOD  Result Value Ref Range Status   Specimen Description   Final    BLOOD LEFT ANTECUBITAL Performed at [facility], [address] [place], [place] [address]    Special Requests   Final    BOTTLES DRAWN AEROBIC AND ANAEROBIC Blood Culture results may not be optimal due to an excessive volume of blood received in culture bottles Performed at [facility], [address] [place], [place] [address]    Culture  Setup Time   Final    GRAM POSITIVE COCCI IN CLUSTERS ANAEROBIC BOTTLE ONLY Organism ID to follow CRITICAL RESULT CALLED TO, READ BACK BY AND VERIFIED WITHTrixie Deis, AT 0981 11/15/18 BY D.VANHOOK Performed at [facility], [address] [place], [place]  [address]    Culture METHICILLIN RESISTANT STAPHYLOCOCCUS AUREUS (A)  Final   Report Status 11/17/2018 FINAL  Final   Organism ID, Bacteria METHICILLIN RESISTANT STAPHYLOCOCCUS AUREUS  Final      Susceptibility   Methicillin resistant staphylococcus aureus - MIC*    CIPROFLOXACIN >=8 RESISTANT Resistant     ERYTHROMYCIN >=8 RESISTANT Resistant     GENTAMICIN <=0.5 SENSITIVE Sensitive     OXACILLIN >=4 RESISTANT Resistant     TETRACYCLINE <=1 SENSITIVE Sensitive     VANCOMYCIN <=0.5 SENSITIVE Sensitive     TRIMETH/SULFA 160 RESISTANT Resistant     CLINDAMYCIN >=8 RESISTANT Resistant     RIFAMPIN <=0.5 SENSITIVE Sensitive     Inducible Clindamycin NEGATIVE Sensitive     * METHICILLIN RESISTANT STAPHYLOCOCCUS AUREUS  Blood Culture ID Panel (Reflexed)     Status: Abnormal   Collection Time: 11/13/18  4:08 PM  Result Value Ref Range Status   Enterococcus species NOT DETECTED NOT DETECTED Final   Listeria monocytogenes NOT DETECTED NOT DETECTED Final   Staphylococcus species DETECTED (A) NOT DETECTED Final    Comment: CRITICAL RESULT CALLED TO, READ BACK BY AND VERIFIED WITH: Peggyann Juba PHARMD, AT 8295 11/15/18 BY D. VANHOOK    Staphylococcus aureus (BCID) DETECTED (A) NOT DETECTED Final    Comment: Methicillin (oxacillin)-resistant Staphylococcus aureus (MRSA). MRSA is predictably resistant to beta-lactam antibiotics (except ceftaroline). Preferred therapy is vancomycin unless clinically contraindicated. Patient requires contact precautions if  hospitalized. CRITICAL RESULT CALLED TO, READ BACK BY AND VERIFIED WITH: Peggyann Juba PHARMD, AT 6213 11/15/18 BY D. VANHOOK    Methicillin resistance DETECTED (A) NOT DETECTED Final    Comment: CRITICAL RESULT CALLED TO, READ BACK BY AND VERIFIED WITH: Peggyann Juba PHARMD, AT 0865 11/15/18 BY D. VANHOOK    Streptococcus species NOT DETECTED NOT DETECTED Final   Streptococcus agalactiae NOT DETECTED NOT DETECTED Final   Streptococcus pneumoniae  NOT DETECTED NOT DETECTED Final   Streptococcus pyogenes NOT DETECTED NOT DETECTED Final   Acinetobacter baumannii NOT DETECTED NOT DETECTED Final   Enterobacteriaceae species NOT DETECTED NOT DETECTED Final   Enterobacter cloacae complex NOT DETECTED NOT DETECTED Final   Escherichia coli  NOT DETECTED NOT DETECTED Final   Klebsiella oxytoca NOT DETECTED NOT DETECTED Final   Klebsiella pneumoniae NOT DETECTED NOT DETECTED Final   Proteus species NOT DETECTED NOT DETECTED Final   Serratia marcescens NOT DETECTED NOT DETECTED Final   Haemophilus influenzae NOT DETECTED NOT DETECTED Final   Neisseria meningitidis NOT DETECTED NOT DETECTED Final   Pseudomonas aeruginosa NOT DETECTED NOT DETECTED Final   Candida albicans NOT DETECTED NOT DETECTED Final   Candida glabrata NOT DETECTED NOT DETECTED Final   Candida krusei NOT DETECTED NOT DETECTED Final   Candida parapsilosis NOT DETECTED NOT DETECTED Final   Candida tropicalis NOT DETECTED NOT DETECTED Final    Comment: Performed at [facility], [address] [place], [place] [address]  Urine culture     Status: Abnormal   Collection Time: 11/13/18  4:11 PM   Specimen: In/Out Cath Urine  Result Value Ref Range Status   Specimen Description   Final    IN/OUT CATH URINE Performed at [facility], [address], [place], [place] [address]    Special Requests   Final    NONE Performed at [facility], [address] [place], [place] [address]    Culture (A)  Final    >=100,000 COLONIES/mL ESCHERICHIA COLI Confirmed Extended Spectrum Beta-Lactamase Producer (ESBL).  In bloodstream infections from ESBL organisms, carbapenems are preferred over piperacillin/tazobactam. They are shown to have a lower risk of mortality. 10,000 COLONIES/mL GROUP B STREP(S.AGALACTIAE)ISOLATED TESTING AGAINST S. AGALACTIAE NOT ROUTINELY PERFORMED DUE TO PREDICTABILITY OF AMP/PEN/VAN SUSCEPTIBILITY. Performed at [facility], [address] [place], [place] [address]    Report Status 11/15/2018 FINAL  Final   Organism ID, Bacteria ESCHERICHIA COLI (A)  Final      Susceptibility   Escherichia coli - MIC*    AMPICILLIN >=32 RESISTANT Resistant     CEFAZOLIN >=64 RESISTANT Resistant     CEFTRIAXONE >=64 RESISTANT Resistant     CIPROFLOXACIN <=0.25 SENSITIVE Sensitive     GENTAMICIN <=1 SENSITIVE Sensitive     IMIPENEM <=0.25 SENSITIVE Sensitive     NITROFURANTOIN 32 SENSITIVE Sensitive     TRIMETH/SULFA >=320 RESISTANT Resistant     AMPICILLIN/SULBACTAM 8 SENSITIVE Sensitive     PIP/TAZO <=4 SENSITIVE Sensitive     Extended ESBL POSITIVE Resistant     * >=100,000 COLONIES/mL ESCHERICHIA COLI  Blood Culture (routine x 2)     Status: None (Preliminary result)   Collection Time: 11/13/18  4:36 PM   Specimen: BLOOD RIGHT FOREARM  Result Value Ref Range Status   Specimen Description   Final    BLOOD RIGHT FOREARM Performed at [facility], [address], [place], [place] [address]    Special Requests   Final    BOTTLES DRAWN AEROBIC ONLY Blood Culture results may not be optimal due to an inadequate volume of blood received in culture bottles Performed at [facility], [address] [place], [place] [address]    Culture   Final    NO GROWTH 4 DAYS Performed at [facility], [address] [place], [place] [address]    Report Status PENDING  Incomplete  SARS Coronavirus 2 by RT PCR (hospital order, performed in [facility] hospital lab) Nasopharyngeal Nasopharyngeal Swab     Status: None   Collection Time: 11/13/18  6:44 PM   Specimen: Nasopharyngeal Swab  Result Value Ref Range Status   SARS Coronavirus 2  NEGATIVE NEGATIVE Final    Comment: (NOTE) If result is NEGATIVE SARS-CoV-2 target nucleic acids are NOT DETECTED. The SARS-CoV-2 RNA is generally detectable in upper and lower  respiratory specimens during the acute phase of infection. The lowest    concentration of SARS-CoV-2 viral copies this assay can detect is 250  copies / mL. A negative result does not preclude SARS-CoV-2 infection  and should not be used as the sole basis for treatment or other  patient management decisions.  A negative result may occur with  improper specimen collection / handling, submission of specimen other  than nasopharyngeal swab, presence of viral mutation(s) within the  areas targeted by this assay, and inadequate number of viral copies  (<250 copies / mL). A negative result must be combined with clinical  observations, patient history, and epidemiological information. If result is POSITIVE SARS-CoV-2 target nucleic acids are DETECTED. The SARS-CoV-2 RNA is generally detectable in upper and lower  respiratory specimens dur ing the acute phase of infection.  Positive  results are indicative of active infection with SARS-CoV-2.  Clinical  correlation with patient history and other diagnostic information is  necessary to determine patient infection status.  Positive results do  not rule out bacterial infection or co-infection with other viruses. If result is PRESUMPTIVE POSTIVE SARS-CoV-2 nucleic acids MAY BE PRESENT.   A presumptive positive result was obtained on the submitted specimen  and confirmed on repeat testing.  While 2019 novel coronavirus  (SARS-CoV-2) nucleic acids may be present in the submitted sample  additional confirmatory testing may be necessary for epidemiological  and / or clinical management purposes  to differentiate between  SARS-CoV-2 and other Sarbecovirus currently known to infect humans.  If clinically indicated additional testing with an alternate test  methodology [phone]) is advised. The SARS-CoV-2 RNA is generally  detectable in upper and lower respiratory sp ecimens during the acute  phase of infection. The expected result is Negative. Fact Sheet for Patients:  BoilerBrush.com.cy Fact Sheet  for Healthcare Providers: https://pope.com/ This test is not yet approved or cleared by the [place] FDA and has been authorized for detection and/or diagnosis of SARS-CoV-2 by FDA under an Emergency Use Authorization (EUA).  This EUA will remain in effect (meaning this test can be used) for the duration of the COVID-19 declaration under Section 564(b)(1) of the Act, 21 U.S.C. section 360bbb-3(b)(1), unless the authorization is terminated or revoked sooner. Performed at [facility], [address], [place], [place] [address]   Surgical pcr screen     Status: Abnormal   Collection Time: 11/15/18  8:25 PM   Specimen: Nasal Mucosa; Nasal Swab  Result Value Ref Range Status   MRSA, PCR POSITIVE (A) NEGATIVE Final    Comment: RESULT CALLED TO, READ BACK BY AND VERIFIED WITH: A PEREZ,RN 11/16/18 0518 RHOLMES    Staphylococcus aureus POSITIVE (A) NEGATIVE Final    Comment: (NOTE) The Xpert SA Assay (FDA approved for NASAL specimens in patients 36 years of age and older), is one component of a comprehensive surveillance program. It is not intended to diagnose infection nor to guide or monitor treatment. Performed at [facility], [address] [place], [place] [address]   Culture, blood (routine x 2)     Status: None (Preliminary result)   Collection Time: 11/16/18  3:53 AM   Specimen: BLOOD RIGHT ARM  Result Value Ref Range Status   Specimen Description   Final    BLOOD RIGHT ARM  Performed at [facility], [address], [place], [place] [address]    Special Requests   Final    AEB Blood Culture adequate volume Performed at [facility], [address], [place], [place] [address]    Culture   Final    NO GROWTH < 24 HOURS Performed at [facility], [address], [place], [place] [address]    Report Status PENDING  Incomplete  Culture, blood (routine x 2)     Status: None  (Preliminary result)   Collection Time: 11/16/18  3:53 AM   Specimen: BLOOD RIGHT HAND  Result Value Ref Range Status   Specimen Description   Final    BLOOD RIGHT HAND Performed at [facility], [address], [place], [place] [address]    Special Requests   Final    AEB Blood Culture adequate volume Performed at [facility], [address] [place], [place] [address]    Culture   Final    NO GROWTH < 24 HOURS Performed at [facility], [address] [place], [place] [address]    Report Status PENDING  Incomplete  Aerobic/Anaerobic Culture (surgical/deep wound)     Status: None (Preliminary result)   Collection Time: 11/16/18  9:55 AM   Specimen: PATH Other; Tissue  Result Value Ref Range Status   Specimen Description   Final    TISSUE Performed at [facility], [address], [place], [place] [address]    Special Requests   Final    NONE Performed at [facility], [address] [place], [place] [address]    Gram Stain   Final    RARE WBC PRESENT,BOTH PMN AND MONONUCLEAR FEW GRAM POSITIVE COCCI IN PAIRS IN CLUSTERS    Culture   Final    ABUNDANT STAPHYLOCOCCUS AUREUS SUSCEPTIBILITIES TO FOLLOW Performed at [facility], [address], [place], [place] [address]    Report Status PENDING  Incomplete    Radiology Reports Ct Maxillofacial W Contrast  Result Date: 11/13/2018 CLINICAL DATA:  Cellulitis of face. Additional history provided: Patient reports temple to right side of face since yesterday, swelling started last night. EXAM: CT MAXILLOFACIAL WITH CONTRAST TECHNIQUE: Multidetector CT imaging of the maxillofacial structures was performed with intravenous contrast. Multiplanar CT image reconstructions were also generated. CONTRAST:  75mL OMNIPAQUE IOHEXOL 300 MG/ML  SOLN COMPARISON:  No pertinent prior studies available for comparison. FINDINGS: Osseous: There are multiple carious teeth.  Subtle periapical lucency surrounding the left lower second premolar and first molar. Orbits: Right periorbital cellulitis. No definite evidence of postseptal extension on the current exam. Sinuses: No significant paranasal sinus disease or mastoid effusion. Soft tissues: There is severe right maxillofacial soft tissue swelling and stranding consistent with cellulitis which extends to the right periorbital region. There is a subtle circumscribed low-attenuation region with peripheral enhancement in the right cheek soft tissues measuring 2.8 x 1.0 x 1.2 cm (AP x TV x CC) (series 3, image 60) (series 7, image 30). This may reflect very early abscess formation and extends anteriorly toward the right cheek skin surface (series 3, image 65). Cellulitis changes also extend to the right perimandibular region and upper right neck. Asymmetric prominence of the right parotid gland may reflect a reactive parotiditis. No appreciable mass or swelling within the nasopharynx, oral cavity, oropharynx, hypopharynx or larynx. Enlarged right cervical chain lymph nodes, likely reactive. An index right level II  lymph node measures 16 mm in short axis Limited intracranial: Unremarkable IMPRESSION: 1. Severe right maxillofacial cellulitis extending to the right periorbital, right perimandibular and right upper neck soft tissues. No evidence of postseptal orbital extension on the current examination. 2. 2.8 cm subtle region of low attenuation with peripheral enhancement in the right cheek soft tissues, as described and which may reflect very early abscess formation. 3. Asymmetric prominence of the right parotid gland, which may reflect reactive parotiditis. Clinical correlation is recommended. 4. Right upper cervical lymphadenopathy, likely reactive. Electronically Signed   By: Kellie Simmering   On: 11/13/2018 17:51    Lab Data:  CBC: Recent Labs  Lab 11/13/18 1540 11/14/18 0147 11/15/18 0429 11/16/18 0353 11/17/18 0459  WBC  34.7* 27.7* 20.7* 13.9* 14.2*  NEUTROABS 28.9*  --  15.2* 9.0*  --   HGB 15.2* 13.9 12.2 12.3 12.1  HCT 46.5* 42.4 37.5 38.2 37.6  MCV 89.6 89.8 90.4 92.0 90.6  PLT 343 323 306 351 884*   Basic Metabolic Panel: Recent Labs  Lab 11/13/18 1540 11/14/18 0147 11/15/18 0429 11/16/18 0353 11/17/18 0459  NA 132* 134* 137 137 141  K 3.8 3.7 4.2 3.8 4.0  CL 94* 96* 105 107 108  CO2 25 25 22  21* 23  GLUCOSE 156* 162* 152* 125* 134*  BUN 7 9 5* 6 5*  CREATININE 0.55 0.50 0.50 0.36* 0.62  CALCIUM 9.7 9.3 9.0 8.9 9.0  MG  --   --  2.3 2.0  --   PHOS  --   --  2.5 3.6  --    GFR: Estimated Creatinine Clearance: 73 mL/min (by C-G formula based on SCr of 0.62 mg/dL). Liver Function Tests: Recent Labs  Lab 11/13/18 1540 11/15/18 0429 11/16/18 0353  AST 23 30 18   ALT 13 23 19   ALKPHOS 104 97 87  BILITOT 0.6 0.5 0.9  PROT 9.0* 7.4 7.1  ALBUMIN 4.3 3.3* 2.8*   No results for input(s): LIPASE, AMYLASE in the last 168 hours. No results for input(s): AMMONIA in the last 168 hours. Coagulation Profile: No results for input(s): INR, PROTIME in the last 168 hours. Cardiac Enzymes: No results for input(s): CKTOTAL, CKMB, CKMBINDEX, TROPONINI in the last 168 hours. BNP (last 3 results) No results for input(s): PROBNP in the last 8760 hours. HbA1C: Recent Labs    11/16/18 0353  HGBA1C 6.1*   CBG: No results for input(s): GLUCAP in the last 168 hours. Lipid Profile: No results for input(s): CHOL, HDL, LDLCALC, TRIG, CHOLHDL, LDLDIRECT in the last 72 hours. Thyroid Function Tests: No results for input(s): TSH, T4TOTAL, FREET4, T3FREE, THYROIDAB in the last 72 hours. Anemia Panel: No results for input(s): VITAMINB12, FOLATE, FERRITIN, TIBC, IRON, RETICCTPCT in the last 72 hours. Urine analysis:    Component Value Date/Time   COLORURINE YELLOW 11/13/2018 1540   APPEARANCEUR CLEAR 11/13/2018 1540   LABSPEC 1.015 11/13/2018 1540   PHURINE 7.0 11/13/2018 1540   GLUCOSEU NEGATIVE  11/13/2018 1540   HGBUR SMALL (A) 11/13/2018 1540   BILIRUBINUR NEGATIVE 11/13/2018 1540   KETONESUR NEGATIVE 11/13/2018 1540   PROTEINUR NEGATIVE 11/13/2018 1540   NITRITE NEGATIVE 11/13/2018 1540   LEUKOCYTESUR NEGATIVE 11/13/2018 1540     Amanda Ray M.D. [facility] 11/17/2018, 3:26 PM  Pager: [phone] Between 7am to 7pm - call Pager - [phone]  After 7pm go to [email or web address] - password [email or web address]  Call night coverage person covering after 7pm

## 2018-11-17 NOTE — Progress Notes (Signed)
Pharmacy Antibiotic Note  Amanda Ray is a 33 y.o. female admitted on 11/13/2018 with cellulitis.  Pharmacy has been consulted for Vancomycin dosing.  Plan: Change vancomycin dosing to 750mg  iv q8hr  Height: 5\' 5"  (165.1 cm) Weight: 100 lb (45.4 kg) IBW/kg (Calculated) : 57  Temp (24hrs), Avg:98 F (36.7 C), Min:97.5 F (36.4 C), Max:98.8 F (37.1 C)  Recent Labs  Lab 11/13/18 1540 11/13/18 1905 11/13/18 2233 11/14/18 0147 11/15/18 0429 11/16/18 0353 11/16/18 2012 11/17/18 0459  WBC 34.7*  --   --  27.7* 20.7* 13.9*  --  14.2*  CREATININE 0.55  --   --  0.50 0.50 0.36*  --  0.62  LATICACIDVEN 2.1* 1.4 1.4 2.2*  --  1.0  --   --   VANCOTROUGH  --   --   --   --   --   --   --  4*  VANCOPEAK  --   --   --   --   --   --  16*  --     Estimated Creatinine Clearance: 73 mL/min (by C-G formula based on SCr of 0.62 mg/dL).    No Known Allergies  Antimicrobials this admission: 10/8 CTX >>10/10 10/8 Vanc >>  Dose adjustments this admission:   Microbiology results: 10/8 BCx: + BCID 10/10 Gpc 1/4 meca+ mrsa--LN 10/10 10/8 UCx: >100K ESBL Ecoli  10/8 SARS-2: Negative 10/10: SA+, MRSA+ 10/11 BCx:    Thank you for allowing pharmacy to be a part of this patient's care.  [name] 11/17/2018 5:57 AM

## 2018-11-17 NOTE — Progress Notes (Signed)
[facility]   Reason for visit: Follow up on bacteremia  Interval History: repeat blood cultures ngtd. WBC 14.2.  AFebrile.  No new complaints.  Oral surgery yesterday.  Culture growing Staph aureus as well.   Vancomycin day 5  Physical Exam: Constitutional:  Vitals:   11/17/18 0557 11/17/18 0912  BP: 109/69 123/83  Pulse: 62   Resp: 16   Temp: 98.4 F (36.9 C)   SpO2: 97%    patient appears in NAD HENT: right facial swelling much improved, much less erythema Respiratory: Normal respiratory effort; CTA B Cardiovascular: RRR GI: soft, nt, nd  Review of Systems: Constitutional: negative for fevers, chills and anorexia Gastrointestinal: negative for nausea and diarrhea Integument/breast: negative for rash  Lab Results  Component Value Date   WBC 14.2 (H) 11/17/2018   HGB 12.1 11/17/2018   HCT 37.6 11/17/2018   MCV 90.6 11/17/2018   PLT 414 (H) 11/17/2018    Lab Results  Component Value Date   CREATININE 0.62 11/17/2018   BUN 5 (L) 11/17/2018   NA 141 11/17/2018   K 4.0 11/17/2018   CL 108 11/17/2018   CO2 23 11/17/2018    Lab Results  Component Value Date   ALT 19 11/16/2018   AST 18 11/16/2018   ALKPHOS 87 11/16/2018     Microbiology: Recent Results (from the past 240 hour(s))  Blood Culture (routine x 2)     Status: Abnormal   Collection Time: 11/13/18  4:08 PM   Specimen: BLOOD  Result Value Ref Range Status   Specimen Description   Final    BLOOD LEFT ANTECUBITAL Performed at [facility], [address], [place], [place] [address]    Special Requests   Final    BOTTLES DRAWN AEROBIC AND ANAEROBIC Blood Culture results may not be optimal due to an excessive volume of blood received in culture bottles Performed at [facility], [address], [place], [place] [address]    Culture  Setup Time   Final    GRAM POSITIVE COCCI IN CLUSTERS ANAEROBIC BOTTLE ONLY Organism ID to follow  CRITICAL RESULT CALLED TO, READ BACK BY AND VERIFIED WITHTrixie Deis, AT 0981 11/15/18 BY D.VANHOOK Performed at [facility], [address], [place], [place] [address]    Culture METHICILLIN RESISTANT STAPHYLOCOCCUS AUREUS (A)  Final   Report Status 11/17/2018 FINAL  Final   Organism ID, Bacteria METHICILLIN RESISTANT STAPHYLOCOCCUS AUREUS  Final      Susceptibility   Methicillin resistant staphylococcus aureus - MIC*    CIPROFLOXACIN >=8 RESISTANT Resistant     ERYTHROMYCIN >=8 RESISTANT Resistant     GENTAMICIN <=0.5 SENSITIVE Sensitive     OXACILLIN >=4 RESISTANT Resistant     TETRACYCLINE <=1 SENSITIVE Sensitive     VANCOMYCIN <=0.5 SENSITIVE Sensitive     TRIMETH/SULFA 160 RESISTANT Resistant     CLINDAMYCIN >=8 RESISTANT Resistant     RIFAMPIN <=0.5 SENSITIVE Sensitive     Inducible Clindamycin NEGATIVE Sensitive     * METHICILLIN RESISTANT STAPHYLOCOCCUS AUREUS  Blood Culture ID Panel (Reflexed)     Status: Abnormal   Collection Time: 11/13/18  4:08 PM  Result Value Ref Range Status   Enterococcus species NOT DETECTED NOT DETECTED Final   Listeria monocytogenes NOT DETECTED NOT DETECTED Final   Staphylococcus species DETECTED (A) NOT DETECTED Final    Comment: CRITICAL RESULT CALLED TO, READ BACK BY AND VERIFIED WITH: J.  GRIMSLEY PHARMD, AT [phone] 11/15/18 BY D. VANHOOK    Staphylococcus aureus (BCID) DETECTED (A) NOT DETECTED Final    Comment: Methicillin (oxacillin)-resistant Staphylococcus aureus (MRSA). MRSA is predictably resistant to beta-lactam antibiotics (except ceftaroline). Preferred therapy is vancomycin unless clinically contraindicated. Patient requires contact precautions if  hospitalized. CRITICAL RESULT CALLED TO, READ BACK BY AND VERIFIED WITH: Peggyann JubaJ. GRIMSLEY PHARMD, AT [phone] 11/15/18 BY D. VANHOOK    Methicillin resistance DETECTED (A) NOT DETECTED Final    Comment: CRITICAL RESULT CALLED TO, READ BACK BY AND VERIFIED WITH: Peggyann JubaJ. GRIMSLEY PHARMD,  AT [phone] 11/15/18 BY D. VANHOOK    Streptococcus species NOT DETECTED NOT DETECTED Final   Streptococcus agalactiae NOT DETECTED NOT DETECTED Final   Streptococcus pneumoniae NOT DETECTED NOT DETECTED Final   Streptococcus pyogenes NOT DETECTED NOT DETECTED Final   Acinetobacter baumannii NOT DETECTED NOT DETECTED Final   Enterobacteriaceae species NOT DETECTED NOT DETECTED Final   Enterobacter cloacae complex NOT DETECTED NOT DETECTED Final   Escherichia coli NOT DETECTED NOT DETECTED Final   Klebsiella oxytoca NOT DETECTED NOT DETECTED Final   Klebsiella pneumoniae NOT DETECTED NOT DETECTED Final   Proteus species NOT DETECTED NOT DETECTED Final   Serratia marcescens NOT DETECTED NOT DETECTED Final   Haemophilus influenzae NOT DETECTED NOT DETECTED Final   Neisseria meningitidis NOT DETECTED NOT DETECTED Final   Pseudomonas aeruginosa NOT DETECTED NOT DETECTED Final   Candida albicans NOT DETECTED NOT DETECTED Final   Candida glabrata NOT DETECTED NOT DETECTED Final   Candida krusei NOT DETECTED NOT DETECTED Final   Candida parapsilosis NOT DETECTED NOT DETECTED Final   Candida tropicalis NOT DETECTED NOT DETECTED Final    Comment: Performed at [facility], [address] [place], [place] [address]  Urine culture     Status: Abnormal   Collection Time: 11/13/18  4:11 PM   Specimen: In/Out Cath Urine  Result Value Ref Range Status   Specimen Description   Final    IN/OUT CATH URINE Performed at [facility], [address] [place], [place] [address]    Special Requests   Final    NONE Performed at [facility], [address] [place], [place] [address]    Culture (A)  Final    >=100,000 COLONIES/mL ESCHERICHIA COLI Confirmed Extended Spectrum Beta-Lactamase Producer (ESBL).  In bloodstream infections from ESBL organisms, carbapenems are preferred over piperacillin/tazobactam. They are shown to have a lower risk of mortality. 10,000  COLONIES/mL GROUP B STREP(S.AGALACTIAE)ISOLATED TESTING AGAINST S. AGALACTIAE NOT ROUTINELY PERFORMED DUE TO PREDICTABILITY OF AMP/PEN/VAN SUSCEPTIBILITY. Performed at [facility], [address] [place], [place] [address]    Report Status 11/15/2018 FINAL  Final   Organism ID, Bacteria ESCHERICHIA COLI (A)  Final      Susceptibility   Escherichia coli - MIC*    AMPICILLIN >=32 RESISTANT Resistant     CEFAZOLIN >=64 RESISTANT Resistant     CEFTRIAXONE >=64 RESISTANT Resistant     CIPROFLOXACIN <=0.25 SENSITIVE Sensitive     GENTAMICIN <=1 SENSITIVE Sensitive     IMIPENEM <=0.25 SENSITIVE Sensitive     NITROFURANTOIN 32 SENSITIVE Sensitive     TRIMETH/SULFA >=320 RESISTANT Resistant     AMPICILLIN/SULBACTAM 8 SENSITIVE Sensitive     PIP/TAZO <=4 SENSITIVE Sensitive     Extended ESBL POSITIVE Resistant     * >=100,000 COLONIES/mL ESCHERICHIA COLI  Blood Culture (routine x 2)     Status: None (Preliminary result)  Collection Time: 11/13/18  4:36 PM   Specimen: BLOOD RIGHT FOREARM  Result Value Ref Range Status   Specimen Description   Final    BLOOD RIGHT FOREARM Performed at [facility], [address], [place], [place] [address]    Special Requests   Final    BOTTLES DRAWN AEROBIC ONLY Blood Culture results may not be optimal due to an inadequate volume of blood received in culture bottles Performed at [facility], [address], [place], [place] [address]    Culture   Final    NO GROWTH 4 DAYS Performed at [facility], [address] [place], [place] [address]    Report Status PENDING  Incomplete  SARS Coronavirus 2 by RT PCR (hospital order, performed in [facility] hospital lab) Nasopharyngeal Nasopharyngeal Swab     Status: None   Collection Time: 11/13/18  6:44 PM   Specimen: Nasopharyngeal Swab  Result Value Ref Range Status   SARS Coronavirus 2 NEGATIVE NEGATIVE Final    Comment: (NOTE) If result is NEGATIVE SARS-CoV-2  target nucleic acids are NOT DETECTED. The SARS-CoV-2 RNA is generally detectable in upper and lower  respiratory specimens during the acute phase of infection. The lowest  concentration of SARS-CoV-2 viral copies this assay can detect is 250  copies / mL. A negative result does not preclude SARS-CoV-2 infection  and should not be used as the sole basis for treatment or other  patient management decisions.  A negative result may occur with  improper specimen collection / handling, submission of specimen other  than nasopharyngeal swab, presence of viral mutation(s) within the  areas targeted by this assay, and inadequate number of viral copies  (<250 copies / mL). A negative result must be combined with clinical  observations, patient history, and epidemiological information. If result is POSITIVE SARS-CoV-2 target nucleic acids are DETECTED. The SARS-CoV-2 RNA is generally detectable in upper and lower  respiratory specimens dur ing the acute phase of infection.  Positive  results are indicative of active infection with SARS-CoV-2.  Clinical  correlation with patient history and other diagnostic information is  necessary to determine patient infection status.  Positive results do  not rule out bacterial infection or co-infection with other viruses. If result is PRESUMPTIVE POSTIVE SARS-CoV-2 nucleic acids MAY BE PRESENT.   A presumptive positive result was obtained on the submitted specimen  and confirmed on repeat testing.  While 2019 novel coronavirus  (SARS-CoV-2) nucleic acids may be present in the submitted sample  additional confirmatory testing may be necessary for epidemiological  and / or clinical management purposes  to differentiate between  SARS-CoV-2 and other Sarbecovirus currently known to infect humans.  If clinically indicated additional testing with an alternate test  methodology [phone]) is advised. The SARS-CoV-2 RNA is generally  detectable in upper and lower  respiratory sp ecimens during the acute  phase of infection. The expected result is Negative. Fact Sheet for Patients:  BoilerBrush.com.cy Fact Sheet for Healthcare Providers: https://pope.com/ This test is not yet approved or cleared by the [place] FDA and has been authorized for detection and/or diagnosis of SARS-CoV-2 by FDA under an Emergency Use Authorization (EUA).  This EUA will remain in effect (meaning this test can be used) for the duration of the COVID-19 declaration under Section 564(b)(1) of the Act, 21 U.S.C. section 360bbb-3(b)(1), unless the authorization is terminated or revoked sooner. Performed at [facility], [address] [place], [place]  [address]   Surgical pcr screen     Status: Abnormal   Collection Time: 11/15/18  8:25 PM   Specimen: Nasal Mucosa; Nasal Swab  Result Value Ref Range Status   MRSA, PCR POSITIVE (A) NEGATIVE Final    Comment: RESULT CALLED TO, READ BACK BY AND VERIFIED WITH: A PEREZ,RN 11/16/18 0518 RHOLMES    Staphylococcus aureus POSITIVE (A) NEGATIVE Final    Comment: (NOTE) The Xpert SA Assay (FDA approved for NASAL specimens in patients 26 years of age and older), is one component of a comprehensive surveillance program. It is not intended to diagnose infection nor to guide or monitor treatment. Performed at [facility], [place] [address] [place], [place] [address]   Culture, blood (routine x 2)     Status: None (Preliminary result)   Collection Time: 11/16/18  3:53 AM   Specimen: BLOOD RIGHT ARM  Result Value Ref Range Status   Specimen Description   Final    BLOOD RIGHT ARM Performed at [facility], [address], [place], [place] [address]    Special Requests   Final    AEB Blood Culture adequate volume Performed at [place] [address] [place], [place] [address]    Culture   Final    NO GROWTH <  24 HOURS Performed at [place] [address], [place], [place] [address]    Report Status PENDING  Incomplete  Culture, blood (routine x 2)     Status: None (Preliminary result)   Collection Time: 11/16/18  3:53 AM   Specimen: BLOOD RIGHT HAND  Result Value Ref Range Status   Specimen Description   Final    BLOOD RIGHT HAND Performed at [facility], [place] [address], [place], [place] [address]    Special Requests   Final    AEB Blood Culture adequate volume Performed at [place] [address], [place], [place] [address]    Culture   Final    NO GROWTH < 24 HOURS Performed at [place] [address] [place], [place] [address]    Report Status PENDING  Incomplete  Aerobic/Anaerobic Culture (surgical/deep wound)     Status: None (Preliminary result)   Collection Time: 11/16/18  9:55 AM   Specimen: PATH Other; Tissue  Result Value Ref Range Status   Specimen Description   Final    TISSUE Performed at [place] [address], [place], [place] [address]    Special Requests   Final    NONE Performed at [facility], [place] [address] [place], [place] [address]    Gram Stain   Final    RARE WBC PRESENT,BOTH PMN AND MONONUCLEAR FEW GRAM POSITIVE COCCI IN PAIRS IN CLUSTERS    Culture   Final    ABUNDANT STAPHYLOCOCCUS AUREUS SUSCEPTIBILITIES TO FOLLOW Performed at [facility], [place] [address] [place],  [address]    Report Status PENDING  Incomplete    Impression/Plan:  1. 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  TTE unrevealing. I recommend a TEE. If no signs of vegetation, will consider oral linezolid at discharge  2.  Facial abscess - s/p I and D yesterday.  Much improved.  On I V vancomycin and will continue.    3.  Asymptomatic bacteruria - no urinary symptoms so no indication for treatment.  4.   Screening - will check for hepatitis C

## 2018-11-17 NOTE — Progress Notes (Signed)
Amanda Ray PROGRESS NOTE:   SUBJECTIVE: Feeling better  OBJECTIVE:  Vitals: Blood pressure 109/69, pulse 62, temperature 98.4 F (36.9 C), temperature source Oral, resp. rate 16, height 5\' 5"  (1.651 m), weight 45.4 kg, last menstrual period 10/30/2018, SpO2 97 %. Lab results: Results for orders placed or performed during the hospital encounter of 11/13/18 (from the past 24 hour(s))  Vancomycin, peak     Status: Abnormal   Collection Time: 11/16/18  8:12 PM  Result Value Ref Range   Vancomycin Pk 16 (L) 30 - 40 ug/mL  Vancomycin, trough     Status: Abnormal   Collection Time: 11/17/18  4:59 AM  Result Value Ref Range   Vancomycin Tr 4 (L) 15 - 20 ug/mL  CBC     Status: Abnormal   Collection Time: 11/17/18  4:59 AM  Result Value Ref Range   WBC 14.2 (H) 4.0 - 10.5 K/uL   RBC 4.15 3.87 - 5.11 MIL/uL   Hemoglobin 12.1 12.0 - 15.0 g/dL   HCT 37.6 36.0 - 46.0 %   MCV 90.6 80.0 - 100.0 fL   MCH 29.2 26.0 - 34.0 pg   MCHC 32.2 30.0 - 36.0 g/dL   RDW 14.6 11.5 - 15.5 %   Platelets 414 (H) 150 - 400 K/uL   nRBC 0.0 0.0 - 0.2 %  Basic metabolic panel     Status: Abnormal   Collection Time: 11/17/18  4:59 AM  Result Value Ref Range   Sodium 141 135 - 145 mmol/L   Potassium 4.0 3.5 - 5.1 mmol/L   Chloride 108 98 - 111 mmol/L   CO2 23 22 - 32 mmol/L   Glucose, Bld 134 (H) 70 - 99 mg/dL   BUN 5 (L) 6 - 20 mg/dL   Creatinine, Ser 0.62 0.44 - 1.00 mg/dL   Calcium 9.0 8.9 - 10.3 mg/dL   GFR calc non Af Amer >60 >60 mL/min   GFR calc Af Amer >60 >60 mL/min   Anion gap 10 5 - 15   Radiology Results: No results found. General appearance: alert, cooperative and no distress Head: Normocephalic, without obvious abnormality, atraumatic Eyes: negative Nose: Nares normal. Septum midline. Mucosa normal. No drainage or sinus tenderness. Throat: lips, mucosa, and tongue normal; teeth and gums normal and extraction sites hemostatic. no purulence or drainage.  Neck: no adenopathy and supple,  symmetrical, trachea midline  Facial: drain intact. Minimal drainage  ASSESSMENT: improved s/p Incision and drainage facial abscess, dental extractions  PLAN: IV antibiotics. D/C drain tomorrow.    Diona Browner 11/17/2018

## 2018-11-17 NOTE — Op Note (Signed)
Amanda Ray, Amanda Ray MEDICAL RECORD CH:[id number] ACCOUNT [id number] DATE OF BIRTH:Feb 06, 1986 FACILITY: [facility] LOCATION: [id number] PHYSICIAN:Chrystian Cupples M. Hitomi Slape, DDS  OPERATIVE REPORT  DATE OF PROCEDURE:  11/16/2018  PREOPERATIVE DIAGNOSES:  Right facial cellulitis, nonrestorable teeth numbers 4 and 19.  POSTOPERATIVE DIAGNOSES:  Right facial cellulitis, nonrestorable teeth numbers 4 and 19, plus facial abscess, right cheek.  PROCEDURE:  Incision and drainage of abscess, right cheek; extraction of teeth numbers 4 and 19.    SURGEON:  Diona Browner, DDS  ANESTHESIA:  General nasal intubation, Dr. Ola Spurr attending.  DESCRIPTION OF PROCEDURE:  The patient was taken to the operating room and placed on the table in supine position.  General anesthesia was administered intravenously and a nasal endotracheal tube was placed and secured.  The eyes were protected and the  patient was prepped and draped for surgery.  The patient was examined and palpated intraorally what had been thought to be a buccal vestibule abscess, turned out to be a right cheek abscess and there was an eschar covering a portion of the cheek, which  was removed with Adsons and then purulent material began expressing spontaneously.  Cultures were taken, aerobic and anaerobic, and Gram stain and the area was then expressed with bimanual pressure.  There was an approximately 5-8 mL expressed from the  area.  Hemostats were introduced and the loculations were spread.  The area was irrigated and then left open.  The attention was turned to the oral cavity.  A bite block was placed in the right side of the mouth and a throat pack was placed, 2% lidocaine  with 1:100,000 epinephrine was infiltrated in a left inferior alveolar block and in right buccal and palatal infiltration around tooth #4.  Then, periosteal elevator was used to reflect the periosteum around tooth #19.  The tooth was elevated with a 301  elevator and removed from the mouth  with dental forceps.  The socket was curetted, irrigated, and closed with 3-0 chromic.  The bite block was repositioned to the other side of the mouth and a periosteal elevator was used to reflect around tooth #4.   The tooth was elevated with a 301 elevator and removed from the mouth with the dental forceps.  The socket was curetted and no sutures were required.  The oral cavity was then irrigated and the throat pack was removed.  Then, local anesthesia was  infiltrated in the right cheek facially, an infraorbital block and lateral facial blocks.  Then, the quarter inch Penrose drain was placed into the wound and sutured to the skin with 4-0 nylon.  Sterile dressing was placed and the patient was left in  care of anesthesia for extubation and transport to recovery room and then to the floor.  ESTIMATED BLOOD LOSS:  Minimal.  COMPLICATIONS:  None.  SPECIMENS:  None.    CULTURE:  Aerobic and anaerobic culture of the right cheek.  DRAINS:  Quarter inch Penrose, right cheek.  JN/NUANCE  D:11/16/2018 T:11/17/2018 JOB:[id number]

## 2018-11-18 LAB — BASIC METABOLIC PANEL
Anion gap: 10 (ref 5–15)
BUN: 7 mg/dL (ref 6–20)
CO2: 25 mmol/L (ref 22–32)
Calcium: 9 mg/dL (ref 8.9–10.3)
Chloride: 102 mmol/L (ref 98–111)
Creatinine, Ser: 0.49 mg/dL (ref 0.44–1.00)
GFR calc Af Amer: >60 mL/min (ref >60)
GFR calc non Af Amer: >60 mL/min (ref >60)
Glucose, Bld: 129 mg/dL — ABNORMAL HIGH (ref 70–99)
Potassium: 3.7 mmol/L (ref 3.5–5.1)
Sodium: 137 mmol/L (ref 135–145)

## 2018-11-18 LAB — CBC
HCT: 39.3 % (ref 36.0–46.0)
Hemoglobin: 12.6 g/dL (ref 12.0–15.0)
MCH: 29.4 pg (ref 26.0–34.0)
MCHC: 32.1 g/dL (ref 30.0–36.0)
MCV: 91.6 fL (ref 80.0–100.0)
Platelets: 417 10*3/uL — ABNORMAL HIGH (ref 150–400)
RBC: 4.29 MIL/uL (ref 3.87–5.11)
RDW: 14.6 % (ref 11.5–15.5)
WBC: 12 10*3/uL — ABNORMAL HIGH (ref 4.0–10.5)
nRBC: 0 % (ref 0.0–0.2)

## 2018-11-18 LAB — CULTURE, BLOOD (ROUTINE X 2): Culture: NO GROWTH

## 2018-11-18 LAB — HEPATITIS C ANTIBODY: HCV Ab: REACTIVE — AB

## 2018-11-18 MED ORDER — ADULT MULTIVITAMIN W/MINERALS CH
1.0000 | ORAL_TABLET | Freq: Every day | ORAL | Status: DC
  Administered 2018-11-18 – 2018-11-20 (×2): 1 via ORAL
  Filled 2018-11-18 (×2): qty 1

## 2018-11-18 MED ORDER — ENSURE ENLIVE PO LIQD
237.0000 mL | Freq: Two times a day (BID) | ORAL | Status: DC
  Administered 2018-11-18 – 2018-11-20 (×4): 237 mL via ORAL

## 2018-11-18 NOTE — H&P (View-Only) (Signed)
°      ° ° ° [facility] °                                                                            ° °Patient Demographics ° °Amanda Ray, is a 33 y.o. female, DOB - 05/11/1985, MRN:[id number] ° °Admit date - 11/13/2018   Admitting Physician Ching T Tu, DO ° °Outpatient Primary MD for the patient is Patient, No Pcp Per ° °Outpatient specialists:  ° °LOS - 5  days  ° °Medical records reviewed and are as summarized below: ° ° ° °Chief Complaint  °Patient presents with  °• Facial Swelling  °    ° °Brief summary  °Amanda Ray is a 33 y.o. female with medical history significant of asthma and polysubstance abuse who presented with severe right facial cellulitis.  Patient also reported having issues with the upper right tooth, that is chipped and did not seek medical care.  She also reported IV heroin on the morning of admission and her usual injection sites are either side of the neck. °CT maxillofacial showed severe right maxillofacial area cellulitis extending to the right periorbital, right peri-mandible, right upper neck soft tissue.  No evidence of post septal orbital extension.  2.8 cm peripheral enhancement in the right cheek soft tissue may represent early abscess formation, asymmetric prominence of the right parotid gland may reflect parotiditis ° °Patient has been seen by ophthalmology, ENT, oral dental surgery.  Plan for or today by oral surgery, Dr. Jensen. ° °Pending TEE on 10/14 at 12 PM at [facility] °Once TEE is completed, patient may be transitioned off to linezolid for discharge (if cleared by ID) ° °Assessment & Plan  ° ° °Principal problem °Sepsis/MRSA bacteremia, secondary to right maxillofacial cellulitis, periorbital involvement, tooth abscess, possible right parotiditis °-CT maxillofacial showed severe right maxilla facial cellulitis extending to right periorbital, right peri-mandible and right upper soft tissue neck, 2.6 cm abscess formation °-Maintaining airway, continue IV vancomycin and  Rocephin °-Patient seen by ophthalmology, recommended continue antibiotics, no evidence of post septal involvement and no orbital signs.  Reconsult if new changes or worsening in vision or orbital signs such as chemosis, APD, diplopia or drop in vision °-Evaluated by ENT Dr. Teoh, recommended continue IV antibiotics per ID °-Status post I&D and extraction by [facility] on 11/16/2018 °-Tolerating solid diet, right facial cellulitis is improving. 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 after midnight ° ° °Lactic acidosis °-Likely due to sepsis,  °-IV fluids discontinued, tolerating diet ° °History of asthma °Stable no wheezing, continue nebs as needed  °  °Polysubstance abuse, history of ongoing IVDU °-Last heroin use IV on the morning of admission, injecting in her neck, smoking and marijuana use °-Patient was placed on clonidine detox protocol °-Currently not in any opioid withdrawals  ° °Nicotine abuse °Continue nicotine patch, counseled on smoking cessation ° °Mild hyponatremia °-Resolved ° °ESBL E. coli urine colonization °-UA with negative leukocytes, negative nitrites, culture showed > 100,000 colonies of ESBL E. Coli °-ID following, patient is currently asymptomatic ° ° °  Protein calorie malnutrition, moderate °Likely due to acute illness, BMI 16.6 °Nutrition consult, advance diet °- added prostat ° ° °Code Status: Full CODE STATUS °DVT Prophylaxis:  Lovenox  °Family Communication: Discussed all imaging results, lab results, explained to the patient  ° ° °Disposition Plan: Awaiting TEE on 10/14. If cleared by ID, possibly discharge after TEE on linezolid, will need duration ° °Time Spent in minutes 35 minutes ° °Procedures:  °2D echo ° °Consultants:   °· ENT Dr. Teoh °· Opthamololgy Dr. Shah °· Dentistry Dr.  Kulinski °· Oral Surgery Dr. Jensen  °· ID Dr.: ° °Antimicrobials:  ° °Anti-infectives (From admission, onward)  ° Start     Dose/Rate Route Frequency Ordered Stop  ° 11/17/18 0600  vancomycin (VANCOCIN) IVPB 750 mg/150 ml premix    ° 750 mg °150 mL/hr over 60 Minutes Intravenous Every 8 hours 11/17/18 0554    ° 11/14/18 1800  cefTRIAXone (ROCEPHIN) 2 g in sodium chloride 0.9 % 100 mL IVPB  Status:  Discontinued    ° 2 g °200 mL/hr over 30 Minutes Intravenous Every 24 hours 11/14/18 0013 11/15/18 1326  ° 11/14/18 0600  vancomycin (VANCOCIN) 500 mg in sodium chloride 0.9 % 100 mL IVPB  Status:  Discontinued    ° 500 mg °100 mL/hr over 60 Minutes Intravenous Every 12 hours 11/13/18 2112 11/17/18 0553  ° 11/13/18 1600  vancomycin (VANCOCIN) IVPB 1000 mg/200 mL premix    ° 1,000 mg °200 mL/hr over 60 Minutes Intravenous  Once 11/13/18 1557 11/13/18 1829  ° 11/13/18 1600  cefTRIAXone (ROCEPHIN) 2 g in sodium chloride 0.9 % 100 mL IVPB    ° 2 g °200 mL/hr over 30 Minutes Intravenous  Once 11/13/18 1557 11/13/18 2023  °  ° ° ° ° ° °Medications ° °Scheduled Meds: °• cloNIDine  0.1 mg Oral BH-qamhs  ° Followed by  °• [START ON 11/19/2018] cloNIDine  0.1 mg Oral QAC breakfast  °• enoxaparin (LOVENOX) injection  40 mg Subcutaneous Q24H  °• feeding supplement (ENSURE ENLIVE)  237 mL Oral BID BM  °• feeding supplement (PRO-STAT SUGAR FREE 64)  30 mL Oral BID  °• multivitamin with minerals  1 tablet Oral Daily  °• nicotine  14 mg Transdermal Daily  ° °Continuous Infusions: °• vancomycin 750 mg (11/18/18 1307)  ° °PRN Meds:.acetaminophen **OR** acetaminophen, dicyclomine, hydrOXYzine, loperamide, methocarbamol, morphine injection, naproxen, ondansetron **OR** ondansetron (ZOFRAN) IV, ondansetron, traMADol, traZODone ° ° ° ° ° °Subjective:  ° °Amanda Ray was seen and examined today.  Facial cellulitis improving, tolerating diet.  Swelling much improved in the last 24 hours.  Pain is also improving.  No fevers or chills.  No chest  pain or shortness of breath, nausea or vomiting or abdominal pain.   ° °Objective:  ° °Vitals:  ° 11/17/18 0912 11/17/18 2000 11/18/18 0539 11/18/18 1343  °BP: 123/83 111/69 117/75 123/85  °Pulse: 66 98 74 91  °Resp:  18 18 16  °Temp:  98.6 °F (37 °C) 98.5 °F (36.9 °C) 98.4 °F (36.9 °C)  °TempSrc:  Oral Oral Oral  °SpO2:  99% 98% 100%  °Weight:      °Height:      ° ° °Intake/Output Summary (Last 24 hours) at 11/18/2018 1712 °Last data filed at 11/18/2018 1348 °Gross per 24 hour  °Intake 2877.48 ml  °Output 4805 ml  °Net -1927.52 ml  ° ° ° °Wt Readings from Last 3 Encounters:  °11/13/18 45.4 kg  ° °Physical Exam °· General: Alert   and oriented x 3, NAD °· Eyes: Right periorbital cellulitis much improved °· HEENT: Dressing removed, facial cellulitis and swelling visibly improving °· Cardiovascular: S1 S2 clear,  RRR. No pedal edema b/l °· Respiratory: CTAB, no wheezing, rales or rhonchi °· Gastrointestinal: Soft, nontender, nondistended, NBS °· Ext: no pedal edema bilaterally °· Neuro: no new deficits °· Musculoskeletal: No cyanosis, clubbing °· Skin: No rashes °· Psych: Normal affect and demeanor, alert and oriented x3  ° ° ° °Data Reviewed:  I have personally reviewed following labs and imaging studies ° °Micro Results °Recent Results (from the past 240 hour(s))  °Blood Culture (routine x 2)     Status: Abnormal  ° Collection Time: 11/13/18  4:08 PM  ° Specimen: BLOOD  °Result Value Ref Range Status  ° Specimen Description   Final  °  BLOOD LEFT ANTECUBITAL °Performed at [facility], [address] [place], [place] [address] °  ° Special Requests   Final  °  BOTTLES DRAWN AEROBIC AND ANAEROBIC Blood Culture results may not be optimal due to an excessive volume of blood received in culture bottles °Performed at [facility], [address] [place], [place] [address] °  ° Culture  Setup Time   Final  °  GRAM POSITIVE COCCI IN CLUSTERS °ANAEROBIC BOTTLE ONLY °Organism ID to  follow °CRITICAL RESULT CALLED TO, READ BACK BY AND VERIFIED WITH: J. GRIMSLEY PHARMD, AT 0652 11/15/18 BY D.VANHOOK °Performed at [facility], [address] [place], [place] [address] °  ° Culture METHICILLIN RESISTANT STAPHYLOCOCCUS AUREUS (A)  Final  ° Report Status 11/17/2018 FINAL  Final  ° Organism ID, Bacteria METHICILLIN RESISTANT STAPHYLOCOCCUS AUREUS  Final  °    Susceptibility  ° Methicillin resistant staphylococcus aureus - MIC*  °  CIPROFLOXACIN >=8 RESISTANT Resistant   °  ERYTHROMYCIN >=8 RESISTANT Resistant   °  GENTAMICIN <=0.5 SENSITIVE Sensitive   °  OXACILLIN >=4 RESISTANT Resistant   °  TETRACYCLINE <=1 SENSITIVE Sensitive   °  VANCOMYCIN <=0.5 SENSITIVE Sensitive   °  TRIMETH/SULFA 160 RESISTANT Resistant   °  CLINDAMYCIN >=8 RESISTANT Resistant   °  RIFAMPIN <=0.5 SENSITIVE Sensitive   °  Inducible Clindamycin NEGATIVE Sensitive   °  * METHICILLIN RESISTANT STAPHYLOCOCCUS AUREUS  °Blood Culture ID Panel (Reflexed)     Status: Abnormal  ° Collection Time: 11/13/18  4:08 PM  °Result Value Ref Range Status  ° Enterococcus species NOT DETECTED NOT DETECTED Final  ° Listeria monocytogenes NOT DETECTED NOT DETECTED Final  ° Staphylococcus species DETECTED (A) NOT DETECTED Final  °  Comment: CRITICAL RESULT CALLED TO, READ BACK BY AND VERIFIED WITH: °J. GRIMSLEY PHARMD, AT 0653 11/15/18 BY D. VANHOOK °  ° Staphylococcus aureus (BCID) DETECTED (A) NOT DETECTED Final  °  Comment: Methicillin (oxacillin)-resistant Staphylococcus aureus (MRSA). MRSA is predictably resistant to beta-lactam antibiotics (except ceftaroline). Preferred therapy is vancomycin unless clinically contraindicated. Patient requires contact precautions if  °hospitalized. °CRITICAL RESULT CALLED TO, READ BACK BY AND VERIFIED WITH: °J. GRIMSLEY PHARMD, AT 0653 11/15/18 BY D. VANHOOK °  ° Methicillin resistance DETECTED (A) NOT DETECTED Final  °  Comment: CRITICAL RESULT CALLED TO, READ BACK BY AND VERIFIED WITH: °J. GRIMSLEY  PHARMD, AT 0653 11/15/18 BY D. VANHOOK °  ° Streptococcus species NOT DETECTED NOT DETECTED Final  ° Streptococcus agalactiae NOT DETECTED NOT DETECTED Final  ° Streptococcus pneumoniae NOT DETECTED NOT DETECTED Final  ° Streptococcus pyogenes NOT   DETECTED NOT DETECTED Final  ° Acinetobacter baumannii NOT DETECTED NOT DETECTED Final  ° Enterobacteriaceae species NOT DETECTED NOT DETECTED Final  ° Enterobacter cloacae complex NOT DETECTED NOT DETECTED Final  ° Escherichia coli NOT DETECTED NOT DETECTED Final  ° Klebsiella oxytoca NOT DETECTED NOT DETECTED Final  ° Klebsiella pneumoniae NOT DETECTED NOT DETECTED Final  ° Proteus species NOT DETECTED NOT DETECTED Final  ° Serratia marcescens NOT DETECTED NOT DETECTED Final  ° Haemophilus influenzae NOT DETECTED NOT DETECTED Final  ° Neisseria meningitidis NOT DETECTED NOT DETECTED Final  ° Pseudomonas aeruginosa NOT DETECTED NOT DETECTED Final  ° Candida albicans NOT DETECTED NOT DETECTED Final  ° Candida glabrata NOT DETECTED NOT DETECTED Final  ° Candida krusei NOT DETECTED NOT DETECTED Final  ° Candida parapsilosis NOT DETECTED NOT DETECTED Final  ° Candida tropicalis NOT DETECTED NOT DETECTED Final  °  Comment: Performed at [facility], [address] [place], [place] [address]  °Urine culture     Status: Abnormal  ° Collection Time: 11/13/18  4:11 PM  ° Specimen: In/Out Cath Urine  °Result Value Ref Range Status  ° Specimen Description   Final  °  IN/OUT CATH URINE °Performed at [facility], [address] [place], [place] [address] °  ° Special Requests   Final  °  NONE °Performed at [facility], [address] [place], [place] [address] °  ° Culture (A)  Final  °  >=100,000 COLONIES/mL ESCHERICHIA COLI °Confirmed Extended Spectrum Beta-Lactamase Producer (ESBL).  In bloodstream infections from ESBL organisms, carbapenems are preferred over piperacillin/tazobactam. They are shown to have a lower risk of  mortality. °10,000 COLONIES/mL GROUP B STREP(S.AGALACTIAE)ISOLATED °TESTING AGAINST S. AGALACTIAE NOT ROUTINELY PERFORMED DUE TO PREDICTABILITY OF AMP/PEN/VAN SUSCEPTIBILITY. °Performed at [facility], [address] [place], [place] [address] °  ° Report Status 11/15/2018 FINAL  Final  ° Organism ID, Bacteria ESCHERICHIA COLI (A)  Final  °    Susceptibility  ° Escherichia coli - MIC*  °  AMPICILLIN >=32 RESISTANT Resistant   °  CEFAZOLIN >=64 RESISTANT Resistant   °  CEFTRIAXONE >=64 RESISTANT Resistant   °  CIPROFLOXACIN <=0.25 SENSITIVE Sensitive   °  GENTAMICIN <=1 SENSITIVE Sensitive   °  IMIPENEM <=0.25 SENSITIVE Sensitive   °  NITROFURANTOIN 32 SENSITIVE Sensitive   °  TRIMETH/SULFA >=320 RESISTANT Resistant   °  AMPICILLIN/SULBACTAM 8 SENSITIVE Sensitive   °  PIP/TAZO <=4 SENSITIVE Sensitive   °  Extended ESBL POSITIVE Resistant   °  * >=100,000 COLONIES/mL ESCHERICHIA COLI  °Blood Culture (routine x 2)     Status: None  ° Collection Time: 11/13/18  4:36 PM  ° Specimen: BLOOD RIGHT FOREARM  °Result Value Ref Range Status  ° Specimen Description   Final  °  BLOOD RIGHT FOREARM °Performed at [facility], [address] [place], [place] [address] °  ° Special Requests   Final  °  BOTTLES DRAWN AEROBIC ONLY Blood Culture results may not be optimal due to an inadequate volume of blood received in culture bottles °Performed at [facility], [address] [place], [place] [address] °  ° Culture   Final  °  NO GROWTH 5 DAYS °Performed at  Hospital Lab, [address] [place], [place] [address] °  ° Report Status 11/18/2018 FINAL  Final  °SARS Coronavirus 2 by RT PCR (hospital order, performed in [place]   hospital lab) Nasopharyngeal Nasopharyngeal Swab     Status: None  ° Collection Time: 11/13/18  6:44 PM  ° Specimen: Nasopharyngeal Swab  °Result Value Ref Range Status  ° SARS Coronavirus 2 NEGATIVE NEGATIVE Final  °  Comment: (NOTE) °If result is NEGATIVE °SARS-CoV-2  target nucleic acids are NOT DETECTED. °The SARS-CoV-2 RNA is generally detectable in upper and lower  °respiratory specimens during the acute phase of infection. The lowest  °concentration of SARS-CoV-2 viral copies this assay can detect is 250  °copies / mL. A negative result does not preclude SARS-CoV-2 infection  °and should not be used as the sole basis for treatment or other  °patient management decisions.  A negative result may occur with  °improper specimen collection / handling, submission of specimen other  °than nasopharyngeal swab, presence of viral mutation(s) within the  °areas targeted by this assay, and inadequate number of viral copies  °(<250 copies / mL). A negative result must be combined with clinical  °observations, patient history, and epidemiological information. °If result is POSITIVE °SARS-CoV-2 target nucleic acids are DETECTED. °The SARS-CoV-2 RNA is generally detectable in upper and lower  °respiratory specimens dur °ing the acute phase of infection.  Positive  °results are indicative of active infection with SARS-CoV-2.  Clinical  °correlation with patient history and other diagnostic information is  °necessary to determine patient infection status.  Positive results do  °not rule out bacterial infection or co-infection with other viruses. °If result is PRESUMPTIVE POSTIVE °SARS-CoV-2 nucleic acids MAY BE PRESENT.   °A presumptive positive result was obtained on the submitted specimen  °and confirmed on repeat testing.  While 2019 novel coronavirus  °(SARS-CoV-2) nucleic acids may be present in the submitted sample  °additional confirmatory testing may be necessary for epidemiological  °and / or clinical management purposes  to differentiate between  °SARS-CoV-2 and other Sarbecovirus currently known to infect humans.  °If clinically indicated additional testing with an alternate test  °methodology ([id number]) is advised. The SARS-CoV-2 RNA is generally  °detectable in upper and lower  respiratory sp °ecimens during the acute  °phase of infection. °The expected result is Negative. °Fact Sheet for Patients:  https://www.fda.gov/media/136312/download °Fact Sheet for Healthcare Providers: °https://www.fda.gov/media/136313/download °This test is not yet approved or cleared by the [place] FDA and °has been authorized for detection and/or diagnosis of SARS-CoV-2 by °FDA under an Emergency Use Authorization (EUA).  This EUA will remain °in effect (meaning this test can be used) for the duration of the °COVID-19 declaration under Section 564(b)(1) of the Act, 21 U.S.C. °section 360bbb-3(b)(1), unless the authorization is terminated or °revoked sooner. °Performed at [facility], [address] °[place], [place] [address] °  °Surgical pcr screen     Status: Abnormal  ° Collection Time: 11/15/18  8:25 PM  ° Specimen: Nasal Mucosa; Nasal Swab  °Result Value Ref Range Status  ° MRSA, PCR POSITIVE (A) NEGATIVE Final  °  Comment: RESULT CALLED TO, READ BACK BY AND VERIFIED WITH: °A PEREZ,RN 11/16/18 0518 RHOLMES °  ° Staphylococcus aureus POSITIVE (A) NEGATIVE Final  °  Comment: (NOTE) °The Xpert SA Assay (FDA approved for NASAL specimens in patients 22 °years of age and older), is one component of a comprehensive °surveillance program. It is not intended to diagnose infection nor to °guide or monitor treatment. °Performed at [facility], [address] °[place], [place] [address] °  °Culture, blood (routine x 2)     Status: None (Preliminary   result)  ° Collection Time: 11/16/18  3:53 AM  ° Specimen: BLOOD RIGHT ARM  °Result Value Ref Range Status  ° Specimen Description   Final  °  BLOOD RIGHT ARM °Performed at [facility], [address] [place], [place] [address] °  ° Special Requests   Final  °  AEB Blood Culture adequate volume °Performed at [facility], [address] [place], [place] [address] °  ° Culture   Final  °  NO GROWTH 2  DAYS °Performed at [facility], [address] [place], [place] [address] °  ° Report Status PENDING  Incomplete  °Culture, blood (routine x 2)     Status: None (Preliminary result)  ° Collection Time: 11/16/18  3:53 AM  ° Specimen: BLOOD RIGHT HAND  °Result Value Ref Range Status  ° Specimen Description   Final  °  BLOOD RIGHT HAND °Performed at [facility], [address] [place], [place] [address] °  ° Special Requests   Final  °  AEB Blood Culture adequate volume °Performed at [facility], [address] [place], [place] [address] °  ° Culture   Final  °  NO GROWTH 2 DAYS °Performed at [facility], [address] [place], [place] [address] °  ° Report Status PENDING  Incomplete  °Aerobic/Anaerobic Culture (surgical/deep wound)     Status: None (Preliminary result)  ° Collection Time: 11/16/18  9:55 AM  ° Specimen: PATH Other; Tissue  °Result Value Ref Range Status  ° Specimen Description   Final  °  TISSUE °Performed at [facility], [address] [place], [place] [address] °  ° Special Requests   Final  °  NONE °Performed at [facility], [address] [place], [place] [address] °  ° Gram Stain   Final  °  RARE WBC PRESENT,BOTH PMN AND MONONUCLEAR °FEW GRAM POSITIVE COCCI IN PAIRS IN CLUSTERS °Performed at  Hospital Lab, [address] [place], [place] [address] °  ° Culture   Final  °  ABUNDANT METHICILLIN RESISTANT STAPHYLOCOCCUS AUREUS °NO ANAEROBES ISOLATED; CULTURE IN PROGRESS FOR 5 DAYS °  ° Report Status PENDING  Incomplete  ° Organism ID, Bacteria METHICILLIN RESISTANT STAPHYLOCOCCUS AUREUS  Final  °    Susceptibility  ° Methicillin resistant staphylococcus aureus - MIC*  °  CIPROFLOXACIN >=8 RESISTANT Resistant   °  ERYTHROMYCIN >=8 RESISTANT Resistant   °  GENTAMICIN <=0.5 SENSITIVE Sensitive   °  OXACILLIN >=4 RESISTANT Resistant   °  TETRACYCLINE <=1 SENSITIVE Sensitive   °  VANCOMYCIN <=0.5 SENSITIVE  Sensitive   °  TRIMETH/SULFA 160 RESISTANT Resistant   °  CLINDAMYCIN >=8 RESISTANT Resistant   °  RIFAMPIN <=0.5 SENSITIVE Sensitive   °  Inducible Clindamycin NEGATIVE Sensitive   °  * ABUNDANT METHICILLIN RESISTANT STAPHYLOCOCCUS AUREUS  ° ° °Radiology Reports °Ct Maxillofacial W Contrast ° °Result Date: 11/13/2018 °CLINICAL DATA:  Cellulitis of face. Additional history provided: Patient reports temple to right side of face since yesterday, swelling started last night. EXAM: CT MAXILLOFACIAL WITH CONTRAST TECHNIQUE: Multidetector CT imaging of the maxillofacial structures was performed with intravenous contrast. Multiplanar CT image reconstructions were also generated. CONTRAST:  75mL OMNIPAQUE IOHEXOL 300 MG/ML  SOLN COMPARISON:  No pertinent prior studies available for comparison. FINDINGS: Osseous: There are multiple carious teeth. Subtle periapical lucency surrounding the left lower second premolar and first   molar. Orbits: Right periorbital cellulitis. No definite evidence of postseptal extension on the current exam. Sinuses: No significant paranasal sinus disease or mastoid effusion. Soft tissues: There is severe right maxillofacial soft tissue swelling and stranding consistent with cellulitis which extends to the right periorbital region. There is a subtle circumscribed low-attenuation region with peripheral enhancement in the right cheek soft tissues measuring 2.8 x 1.0 x 1.2 cm (AP x TV x CC) (series 3, image 60) (series 7, image 30). This may reflect very early abscess formation and extends anteriorly toward the right cheek skin surface (series 3, image 65). Cellulitis changes also extend to the right perimandibular region and upper right neck. Asymmetric prominence of the right parotid gland may reflect a reactive parotiditis. No appreciable mass or swelling within the nasopharynx, oral cavity, oropharynx, hypopharynx or larynx. Enlarged right cervical chain lymph nodes, likely reactive. An index right  level II lymph node measures 16 mm in short axis Limited intracranial: Unremarkable IMPRESSION: 1. Severe right maxillofacial cellulitis extending to the right periorbital, right perimandibular and right upper neck soft tissues. No evidence of postseptal orbital extension on the current examination. 2. 2.8 cm subtle region of low attenuation with peripheral enhancement in the right cheek soft tissues, as described and which may reflect very early abscess formation. 3. Asymmetric prominence of the right parotid gland, which may reflect reactive parotiditis. Clinical correlation is recommended. 4. Right upper cervical lymphadenopathy, likely reactive. Electronically Signed   By: Kyle  Golden   On: 11/13/2018 17:51  ° ° °Lab Data: ° °CBC: °Recent Labs  °Lab 11/13/18 °1540 11/14/18 °0147 11/15/18 °0429 11/16/18 °0353 11/17/18 °0459 11/18/18 °0410  °WBC 34.7* 27.7* 20.7* 13.9* 14.2* 12.0*  °NEUTROABS 28.9*  --  15.2* 9.0*  --   --   °HGB 15.2* 13.9 12.2 12.3 12.1 12.6  °HCT 46.5* 42.4 37.5 38.2 37.6 39.3  °MCV 89.6 89.8 90.4 92.0 90.6 91.6  °PLT 343 323 306 351 414* 417*  ° °Basic Metabolic Panel: °Recent Labs  °Lab 11/14/18 °0147 11/15/18 °0429 11/16/18 °0353 11/17/18 °0459 11/18/18 °0410  °NA 134* 137 137 141 137  °K 3.7 4.2 3.8 4.0 3.7  °CL 96* 105 107 108 102  °CO2 25 22 21* 23 25  °GLUCOSE 162* 152* 125* 134* 129*  °BUN 9 5* 6 5* 7  °CREATININE 0.50 0.50 0.36* 0.62 0.49  °CALCIUM 9.3 9.0 8.9 9.0 9.0  °MG  --  2.3 2.0  --   --   °PHOS  --  2.5 3.6  --   --   ° °GFR: °Estimated Creatinine Clearance: 73 mL/min (by C-G formula based on SCr of 0.49 mg/dL). °Liver Function Tests: °Recent Labs  °Lab 11/13/18 °1540 11/15/18 °0429 11/16/18 °0353  °AST 23 30 18  °ALT 13 23 19  °ALKPHOS 104 97 87  °BILITOT 0.6 0.5 0.9  °PROT 9.0* 7.4 7.1  °ALBUMIN 4.3 3.3* 2.8*  ° °No results for input(s): LIPASE, AMYLASE in the last 168 hours. °No results for input(s): AMMONIA in the last 168 hours. °Coagulation Profile: °No results for  input(s): INR, PROTIME in the last 168 hours. °Cardiac Enzymes: °No results for input(s): CKTOTAL, CKMB, CKMBINDEX, TROPONINI in the last 168 hours. °BNP (last 3 results) °No results for input(s): PROBNP in the last 8760 hours. °HbA1C: °Recent Labs  °  11/16/18 °0353  °HGBA1C 6.1*  ° °CBG: °No results for input(s): GLUCAP in the last 168 hours. °Lipid Profile: °No results for input(s): CHOL, HDL, LDLCALC, TRIG, CHOLHDL, LDLDIRECT in   the last 72 hours. °Thyroid Function Tests: °No results for input(s): TSH, T4TOTAL, FREET4, T3FREE, THYROIDAB in the last 72 hours. °Anemia Panel: °No results for input(s): VITAMINB12, FOLATE, FERRITIN, TIBC, IRON, RETICCTPCT in the last 72 hours. °Urine analysis: °   °Component Value Date/Time  ° COLORURINE YELLOW 11/13/2018 1540  ° APPEARANCEUR CLEAR 11/13/2018 1540  ° LABSPEC 1.015 11/13/2018 1540  ° PHURINE 7.0 11/13/2018 1540  ° GLUCOSEU NEGATIVE 11/13/2018 1540  ° HGBUR SMALL (A) 11/13/2018 1540  ° BILIRUBINUR NEGATIVE 11/13/2018 1540  ° KETONESUR NEGATIVE 11/13/2018 1540  ° PROTEINUR NEGATIVE 11/13/2018 1540  ° NITRITE NEGATIVE 11/13/2018 1540  ° LEUKOCYTESUR NEGATIVE 11/13/2018 1540  ° ° ° °Rollie Hynek M.D. °[facility] °11/18/2018, 5:12 PM ° °Pager: [phone] °Between 7am to 7pm - call Pager - [phone] ° °After 7pm go to [email or web address] - password [email or web address] ° °Call night coverage person covering after 7pm ° ° °

## 2018-11-18 NOTE — Progress Notes (Signed)
[facility] PROGRESS NOTE:   SUBJECTIVE: Resting comfortably. OBJECTIVE:  Vitals: Blood pressure 117/75, pulse 74, temperature 98.5 F (36.9 C), temperature source Oral, resp. rate 18, height 5\' 5"  (1.651 m), weight 45.4 kg, last menstrual period 10/30/2018, SpO2 98 %. Lab results: Results for orders placed or performed during the hospital encounter of 11/13/18 (from the past 24 hour(s))  CBC     Status: Abnormal   Collection Time: 11/18/18  4:10 AM  Result Value Ref Range   WBC 12.0 (H) 4.0 - 10.5 K/uL   RBC 4.29 3.87 - 5.11 MIL/uL   Hemoglobin 12.6 12.0 - 15.0 g/dL   HCT 39.3 36.0 - 46.0 %   MCV 91.6 80.0 - 100.0 fL   MCH 29.4 26.0 - 34.0 pg   MCHC 32.1 30.0 - 36.0 g/dL   RDW 14.6 11.5 - 15.5 %   Platelets 417 (H) 150 - 400 K/uL   nRBC 0.0 0.0 - 0.2 %  Basic metabolic panel     Status: Abnormal   Collection Time: 11/18/18  4:10 AM  Result Value Ref Range   Sodium 137 135 - 145 mmol/L   Potassium 3.7 3.5 - 5.1 mmol/L   Chloride 102 98 - 111 mmol/L   CO2 25 22 - 32 mmol/L   Glucose, Bld 129 (H) 70 - 99 mg/dL   BUN 7 6 - 20 mg/dL   Creatinine, Ser 0.49 0.44 - 1.00 mg/dL   Calcium 9.0 8.9 - 10.3 mg/dL   GFR calc non Af Amer >60 >60 mL/min   GFR calc Af Amer >60 >60 mL/min   Anion gap 10 5 - 15   Radiology Results: No results found. General appearance: alert, cooperative and no distress Head: Normocephalic, without obvious abnormality, atraumatic Eyes: negative Nose: Nares normal. Septum midline. Mucosa normal. No drainage or sinus tenderness. Throat: lips, mucosa, and tongue normal; teeth and gums normal and oral healing normally. no edema, fluctuance, trismus.  Neck: no adenopathy and supple, symmetrical, trachea midline  Facial: minimal drainage, minimal edema  ASSESSMENT: Continues to improve.   PLAN: drain removed bedside. Will follow.   Diona Browner 11/18/2018

## 2018-11-18 NOTE — Progress Notes (Signed)
    [facility] has been requested to perform a transesophageal echocardiogram on 11/19/2018 for bacteremia.  After careful review of history and examination, the risks and benefits of transesophageal echocardiogram have been explained including risks of esophageal damage, perforation (1:10,000 risk), bleeding, pharyngeal hematoma as well as other potential complications associated with conscious sedation including aspiration, arrhythmia, respiratory failure and death. Alternatives to treatment were discussed, questions were answered. Patient is willing to proceed.   TEE scheduled for 11/19/2018 at 12pm with Dr. Magda Bernheim, PA-C 11/18/2018 8:56 AM

## 2018-11-18 NOTE — Progress Notes (Signed)
Initial Nutrition Assessment  DOCUMENTATION CODES:   Underweight  INTERVENTION:   -Ensure Enlive po BID, each supplement provides 350 kcal and 20 grams of protein -Prostat liquid protein PO 30 ml BID with meals, each supplement provides 100 kcal, 15 grams protein. -Multivitamin with minerals daily  NUTRITION DIAGNOSIS:   Increased nutrient needs related to wound healing(polysubstance abuse, s/p dental extractions) as evidenced by estimated needs.  GOAL:   Patient will meet greater than or equal to 90% of their needs  MONITOR:   PO intake, Supplement acceptance, Labs, Weight trends, I & O's  REASON FOR ASSESSMENT:   Consult Assessment of nutrition requirement/status  ASSESSMENT:   33 y.o. female with medical history significant of asthma and polysubstance abuse who presented with severe right facial cellulitis.  Patient also reported having issues with the upper right tooth, that is chipped and did not seek medical care.  She also reported IV heroin on the morning of admission and her usual injection sites are either side of the neck. 10/11: s/p I&D of cheek abscess, dental extractions  **RD working remotely**  Patient with history of polysubstance abuse, pt reports using heroin PTA. Pt to be NPO 10/14 for TEE.  Pt is consuming 100% of meals at this time. Pt is also taking Prostat with no issues. Would recommend additional supplement such as Ensure given weight loss and underweight status.   Per weight records available in care everywhere, pt weighed 136 lbs on 6/30. Pt has lost 36 lbs since then (26% wt loss x 3.5 months, significant for time frame).   I/Os: +4.1L since admit UOP: 800 ml so far today  Labs reviewed. Medications reviewed.  NUTRITION - FOCUSED PHYSICAL EXAM:  Unable to perform -working remotely.  Diet Order:   Diet Order            Diet NPO time specified  Diet effective midnight        DIET SOFT Room service appropriate? Yes; Fluid consistency:  Thin  Diet effective now              EDUCATION NEEDS:   No education needs have been identified at this time  Skin:  Skin Assessment: Skin Integrity Issues: Skin Integrity Issues:: Incisions Incisions: facial  Last BM:  10/12  Height:   Ht Readings from Last 1 Encounters:  11/13/18 5\' 5"  (1.651 m)    Weight:   Wt Readings from Last 1 Encounters:  11/13/18 45.4 kg    Ideal Body Weight:  56.8 kg  BMI:  Body mass index is 16.64 kg/m.  Estimated Nutritional Needs:   Kcal:  1500-1700  Protein:  70-80g  Fluid:  1.7L/day  Clayton Bibles, MS, RD, LDN Inpatient Clinical Dietitian Pager: [phone] After Hours Pager: [phone]

## 2018-11-18 NOTE — Progress Notes (Signed)
On call paged (Schorr NP) about BP of 92/57 and HR 82 and if it is okay to hold Clonidine since DBP <60, patient is calm and asymptomatic. Order given to hold Clonidine for tonight. Norlene Duel RN, BSN

## 2018-11-18 NOTE — Progress Notes (Signed)
[facility]                                                                              Patient Demographics  Amanda Ray, is a 33 y.o. female, DOB - Dec 10, 1985, RUE:[id number]  Admit date - 11/13/2018   Admitting Physician Anselm Jungling, DO  Outpatient Primary MD for the patient is Patient, No Pcp Per  Outpatient specialists:   LOS - 5  days   Medical records reviewed and are as summarized below:    Chief Complaint  Patient presents with   Facial Swelling       Brief summary  Amanda Hickeyis a 33 y.o.femalewith medical history significant ofasthma and polysubstance abuse who presented with severe right facial cellulitis.  Patient also reported having issues with the upper right tooth, that is chipped and did not seek medical care.  She also reported IV heroin on the morning of admission and her usual injection sites are either side of the neck. CT maxillofacial showed severe right maxillofacial area cellulitis extending to the right periorbital, right peri-mandible, right upper neck soft tissue.  No evidence of post septal orbital extension.  2.8 cm peripheral enhancement in the right cheek soft tissue may represent early abscess formation, asymmetric prominence of the right parotid gland may reflect parotiditis  Patient has been seen by ophthalmology, ENT, oral dental surgery.  Plan for or today by oral surgery, Dr. Barbette Merino.  Pending TEE on 10/14 at 12 PM at [facility] Once TEE is completed, patient may be transitioned off to linezolid for discharge (if cleared by ID)  Assessment & Plan    Principal problem Sepsis/MRSA bacteremia, secondary to right maxillofacial cellulitis, periorbital involvement, tooth abscess, possible right parotiditis -CT maxillofacial showed severe right maxilla facial cellulitis extending to right periorbital, right peri-mandible and right upper soft tissue neck, 2.6 cm abscess formation -Maintaining airway, continue IV vancomycin and  Rocephin -Patient seen by ophthalmology, recommended continue antibiotics, no evidence of post septal involvement and no orbital signs.  Reconsult if new changes or worsening in vision or orbital signs such as chemosis, APD, diplopia or drop in vision -Evaluated by ENT Dr. Suszanne Conners, recommended continue IV antibiotics per ID -Status post I&D and extraction by OMFS on 11/16/2018 -Tolerating solid diet, right facial cellulitis is improving.  MRSA bacteremia -History of IVDU injecting into her neck, blood cultures 1+ with MRSA, high risk of endocarditis -2D echo showed EF of 60-65%, no obvious vegetations, normal LV function -TEE scheduled for 11/19/2018 at 12 PM, n.p.o. after midnight   Lactic acidosis -Likely due to sepsis,  -IV fluids discontinued, tolerating diet  History of asthma Stable no wheezing, continue nebs as needed   Polysubstance abuse, history of ongoing IVDU -Last heroin use IV on the morning of admission, injecting in her neck, smoking and marijuana use -Patient was placed on clonidine detox protocol -Currently not in any opioid withdrawals   Nicotine abuse Continue nicotine patch, counseled on smoking cessation  Mild hyponatremia -Resolved  ESBL E. coli urine colonization -UA with negative leukocytes, negative nitrites, culture showed > 100,000 colonies of ESBL E. Coli -ID following, patient is currently asymptomatic  Protein calorie malnutrition, moderate Likely due to acute illness, BMI 16.6 Nutrition consult, advance diet - added prostat   Code Status: Full CODE STATUS DVT Prophylaxis:  Lovenox  Family Communication: Discussed all imaging results, lab results, explained to the patient    Disposition Plan: Awaiting TEE on 10/14. If cleared by ID, possibly discharge after TEE on linezolid, will need duration  Time Spent in minutes 35 minutes  Procedures:  2D echo  Consultants:    ENT Dr. Suszanne Conners  Opthamololgy Dr. Sherryll Burger  Dentistry Dr.  Kristin Bruins  Oral Surgery Dr. Barbette Merino   ID Dr.:  Antimicrobials:   Anti-infectives (From admission, onward)   Start     Dose/Rate Route Frequency Ordered Stop   11/17/18 0600  vancomycin (VANCOCIN) IVPB 750 mg/150 ml premix     750 mg 150 mL/hr over 60 Minutes Intravenous Every 8 hours 11/17/18 0554     11/14/18 1800  cefTRIAXone (ROCEPHIN) 2 g in sodium chloride 0.9 % 100 mL IVPB  Status:  Discontinued     2 g 200 mL/hr over 30 Minutes Intravenous Every 24 hours 11/14/18 0013 11/15/18 1326   11/14/18 0600  vancomycin (VANCOCIN) 500 mg in sodium chloride 0.9 % 100 mL IVPB  Status:  Discontinued     500 mg 100 mL/hr over 60 Minutes Intravenous Every 12 hours 11/13/18 2112 11/17/18 0553   11/13/18 1600  vancomycin (VANCOCIN) IVPB 1000 mg/200 mL premix     1,000 mg 200 mL/hr over 60 Minutes Intravenous  Once 11/13/18 1557 11/13/18 1829   11/13/18 1600  cefTRIAXone (ROCEPHIN) 2 g in sodium chloride 0.9 % 100 mL IVPB     2 g 200 mL/hr over 30 Minutes Intravenous  Once 11/13/18 1557 11/13/18 2023         Medications  Scheduled Meds:  cloNIDine  0.1 mg Oral BH-qamhs   Followed by   Melene Muller ON 11/19/2018] cloNIDine  0.1 mg Oral QAC breakfast   enoxaparin (LOVENOX) injection  40 mg Subcutaneous Q24H   feeding supplement (ENSURE ENLIVE)  237 mL Oral BID BM   feeding supplement (PRO-STAT SUGAR FREE 64)  30 mL Oral BID   multivitamin with minerals  1 tablet Oral Daily   nicotine  14 mg Transdermal Daily   Continuous Infusions:  vancomycin 750 mg (11/18/18 1307)   PRN Meds:.acetaminophen **OR** acetaminophen, dicyclomine, hydrOXYzine, loperamide, methocarbamol, morphine injection, naproxen, ondansetron **OR** ondansetron (ZOFRAN) IV, ondansetron, traMADol, traZODone      Subjective:   Amanda Ray was seen and examined today.  Facial cellulitis improving, tolerating diet.  Swelling much improved in the last 24 hours.  Pain is also improving.  No fevers or chills.  No chest  pain or shortness of breath, nausea or vomiting or abdominal pain.    Objective:   Vitals:   11/17/18 0912 11/17/18 2000 11/18/18 0539 11/18/18 1343  BP: 123/83 111/69 117/75 123/85  Pulse: 66 98 74 91  Resp:  Temp:  98.6 F (37 C) 98.5 F (36.9 C) 98.4 F (36.9 C)  TempSrc:  Oral Oral Oral  SpO2:  99% 98% 100%  Weight:      Height:        Intake/Output Summary (Last 24 hours) at 11/18/2018 1712 Last data filed at 11/18/2018 1348 Gross per 24 hour  Intake 2877.48 ml  Output 4805 ml  Net -1927.52 ml     Wt Readings from Last 3 Encounters:  11/13/18 45.4 kg   Physical Exam  General: Alert  and oriented x 3, NAD  Eyes: Right periorbital cellulitis much improved  HEENT: Dressing removed, facial cellulitis and swelling visibly improving  Cardiovascular: S1 S2 clear,  RRR. No pedal edema b/l  Respiratory: CTAB, no wheezing, rales or rhonchi  Gastrointestinal: Soft, nontender, nondistended, NBS  Ext: no pedal edema bilaterally  Neuro: no new deficits  Musculoskeletal: No cyanosis, clubbing  Skin: No rashes  Psych: Normal affect and demeanor, alert and oriented x3     Data Reviewed:  I have personally reviewed following labs and imaging studies  Micro Results Recent Results (from the past 240 hour(s))  Blood Culture (routine x 2)     Status: Abnormal   Collection Time: 11/13/18  4:08 PM   Specimen: BLOOD  Result Value Ref Range Status   Specimen Description   Final    BLOOD LEFT ANTECUBITAL Performed at [facility], [address], [place], [place] [address]    Special Requests   Final    BOTTLES DRAWN AEROBIC AND ANAEROBIC Blood Culture results may not be optimal due to an excessive volume of blood received in culture bottles Performed at [facility], [address] [place], [place] [address]    Culture  Setup Time   Final    GRAM POSITIVE COCCI IN CLUSTERS ANAEROBIC BOTTLE ONLY Organism ID to  follow CRITICAL RESULT CALLED TO, READ BACK BY AND VERIFIED WITHTrixie Deis, AT 8841 11/15/18 BY D.VANHOOK Performed at [facility], [address], [place], [place] [address]    Culture METHICILLIN RESISTANT STAPHYLOCOCCUS AUREUS (A)  Final   Report Status 11/17/2018 FINAL  Final   Organism ID, Bacteria METHICILLIN RESISTANT STAPHYLOCOCCUS AUREUS  Final      Susceptibility   Methicillin resistant staphylococcus aureus - MIC*    CIPROFLOXACIN >=8 RESISTANT Resistant     ERYTHROMYCIN >=8 RESISTANT Resistant     GENTAMICIN <=0.5 SENSITIVE Sensitive     OXACILLIN >=4 RESISTANT Resistant     TETRACYCLINE <=1 SENSITIVE Sensitive     VANCOMYCIN <=0.5 SENSITIVE Sensitive     TRIMETH/SULFA 160 RESISTANT Resistant     CLINDAMYCIN >=8 RESISTANT Resistant     RIFAMPIN <=0.5 SENSITIVE Sensitive     Inducible Clindamycin NEGATIVE Sensitive     * METHICILLIN RESISTANT STAPHYLOCOCCUS AUREUS  Blood Culture ID Panel (Reflexed)     Status: Abnormal   Collection Time: 11/13/18  4:08 PM  Result Value Ref Range Status   Enterococcus species NOT DETECTED NOT DETECTED Final   Listeria monocytogenes NOT DETECTED NOT DETECTED Final   Staphylococcus species DETECTED (A) NOT DETECTED Final    Comment: CRITICAL RESULT CALLED TO, READ BACK BY AND VERIFIED WITH: Peggyann Juba PHARMD, AT 0160 11/15/18 BY D. VANHOOK    Staphylococcus aureus (BCID) DETECTED (A) NOT DETECTED Final    Comment: Methicillin (oxacillin)-resistant Staphylococcus aureus (MRSA). MRSA is predictably resistant to beta-lactam antibiotics (except ceftaroline). Preferred therapy is vancomycin unless clinically contraindicated. Patient requires contact precautions if  hospitalized. CRITICAL RESULT CALLED TO, READ BACK BY AND VERIFIED WITH: Peggyann Juba PHARMD, AT 1093 11/15/18 BY D. VANHOOK    Methicillin resistance DETECTED (A) NOT DETECTED Final    Comment: CRITICAL RESULT CALLED TO, READ BACK BY AND VERIFIED WITH: Peggyann Juba  PHARMD, AT 2355 11/15/18 BY D. VANHOOK    Streptococcus species NOT DETECTED NOT DETECTED Final   Streptococcus agalactiae NOT DETECTED NOT DETECTED Final   Streptococcus pneumoniae NOT DETECTED NOT DETECTED Final   Streptococcus pyogenes NOT  DETECTED NOT DETECTED Final   Acinetobacter baumannii NOT DETECTED NOT DETECTED Final   Enterobacteriaceae species NOT DETECTED NOT DETECTED Final   Enterobacter cloacae complex NOT DETECTED NOT DETECTED Final   Escherichia coli NOT DETECTED NOT DETECTED Final   Klebsiella oxytoca NOT DETECTED NOT DETECTED Final   Klebsiella pneumoniae NOT DETECTED NOT DETECTED Final   Proteus species NOT DETECTED NOT DETECTED Final   Serratia marcescens NOT DETECTED NOT DETECTED Final   Haemophilus influenzae NOT DETECTED NOT DETECTED Final   Neisseria meningitidis NOT DETECTED NOT DETECTED Final   Pseudomonas aeruginosa NOT DETECTED NOT DETECTED Final   Candida albicans NOT DETECTED NOT DETECTED Final   Candida glabrata NOT DETECTED NOT DETECTED Final   Candida krusei NOT DETECTED NOT DETECTED Final   Candida parapsilosis NOT DETECTED NOT DETECTED Final   Candida tropicalis NOT DETECTED NOT DETECTED Final    Comment: Performed at [facility], [address], [place], [place] [address]  Urine culture     Status: Abnormal   Collection Time: 11/13/18  4:11 PM   Specimen: In/Out Cath Urine  Result Value Ref Range Status   Specimen Description   Final    IN/OUT CATH URINE Performed at [facility], [address] [place], [place] [address]    Special Requests   Final    NONE Performed at [facility], [address], [place], [place] [address]    Culture (A)  Final    >=100,000 COLONIES/mL ESCHERICHIA COLI Confirmed Extended Spectrum Beta-Lactamase Producer (ESBL).  In bloodstream infections from ESBL organisms, carbapenems are preferred over piperacillin/tazobactam. They are shown to have a lower risk of  mortality. 10,000 COLONIES/mL GROUP B STREP(S.AGALACTIAE)ISOLATED TESTING AGAINST S. AGALACTIAE NOT ROUTINELY PERFORMED DUE TO PREDICTABILITY OF AMP/PEN/VAN SUSCEPTIBILITY. Performed at [facility], [address], [place], [place] [address]    Report Status 11/15/2018 FINAL  Final   Organism ID, Bacteria ESCHERICHIA COLI (A)  Final      Susceptibility   Escherichia coli - MIC*    AMPICILLIN >=32 RESISTANT Resistant     CEFAZOLIN >=64 RESISTANT Resistant     CEFTRIAXONE >=64 RESISTANT Resistant     CIPROFLOXACIN <=0.25 SENSITIVE Sensitive     GENTAMICIN <=1 SENSITIVE Sensitive     IMIPENEM <=0.25 SENSITIVE Sensitive     NITROFURANTOIN 32 SENSITIVE Sensitive     TRIMETH/SULFA >=320 RESISTANT Resistant     AMPICILLIN/SULBACTAM 8 SENSITIVE Sensitive     PIP/TAZO <=4 SENSITIVE Sensitive     Extended ESBL POSITIVE Resistant     * >=100,000 COLONIES/mL ESCHERICHIA COLI  Blood Culture (routine x 2)     Status: None   Collection Time: 11/13/18  4:36 PM   Specimen: BLOOD RIGHT FOREARM  Result Value Ref Range Status   Specimen Description   Final    BLOOD RIGHT FOREARM Performed at [facility], [address] [place], [place] [address]    Special Requests   Final    BOTTLES DRAWN AEROBIC ONLY Blood Culture results may not be optimal due to an inadequate volume of blood received in culture bottles Performed at [facility], [address] [place], [place] [address]    Culture   Final    NO GROWTH 5 DAYS Performed at [facility], [address], [place], [place] [address]    Report Status 11/18/2018 FINAL  Final  SARS Coronavirus 2 by RT PCR (hospital order, performed in [facility]  hospital lab) Nasopharyngeal Nasopharyngeal Swab     Status: None   Collection Time: 11/13/18  6:44 PM   Specimen: Nasopharyngeal Swab  Result Value Ref Range Status   SARS Coronavirus 2 NEGATIVE NEGATIVE Final    Comment: (NOTE) If result is NEGATIVE SARS-CoV-2  target nucleic acids are NOT DETECTED. The SARS-CoV-2 RNA is generally detectable in upper and lower  respiratory specimens during the acute phase of infection. The lowest  concentration of SARS-CoV-2 viral copies this assay can detect is 250  copies / mL. A negative result does not preclude SARS-CoV-2 infection  and should not be used as the sole basis for treatment or other  patient management decisions.  A negative result may occur with  improper specimen collection / handling, submission of specimen other  than nasopharyngeal swab, presence of viral mutation(s) within the  areas targeted by this assay, and inadequate number of viral copies  (<250 copies / mL). A negative result must be combined with clinical  observations, patient history, and epidemiological information. If result is POSITIVE SARS-CoV-2 target nucleic acids are DETECTED. The SARS-CoV-2 RNA is generally detectable in upper and lower  respiratory specimens dur ing the acute phase of infection.  Positive  results are indicative of active infection with SARS-CoV-2.  Clinical  correlation with patient history and other diagnostic information is  necessary to determine patient infection status.  Positive results do  not rule out bacterial infection or co-infection with other viruses. If result is PRESUMPTIVE POSTIVE SARS-CoV-2 nucleic acids MAY BE PRESENT.   A presumptive positive result was obtained on the submitted specimen  and confirmed on repeat testing.  While 2019 novel coronavirus  (SARS-CoV-2) nucleic acids may be present in the submitted sample  additional confirmatory testing may be necessary for epidemiological  and / or clinical management purposes  to differentiate between  SARS-CoV-2 and other Sarbecovirus currently known to infect humans.  If clinically indicated additional testing with an alternate test  methodology [phone]) is advised. The SARS-CoV-2 RNA is generally  detectable in upper and lower  respiratory sp ecimens during the acute  phase of infection. The expected result is Negative. Fact Sheet for Patients:  StrictlyIdeas.no Fact Sheet for Healthcare Providers: BankingDealers.co.za This test is not yet approved or cleared by the [place] FDA and has been authorized for detection and/or diagnosis of SARS-CoV-2 by FDA under an Emergency Use Authorization (EUA).  This EUA will remain in effect (meaning this test can be used) for the duration of the COVID-19 declaration under Section 564(b)(1) of the Act, 21 U.S.C. section 360bbb-3(b)(1), unless the authorization is terminated or revoked sooner. Performed at [facility], [place] [address], [place], [place] [address]   Surgical pcr screen     Status: Abnormal   Collection Time: 11/15/18  8:25 PM   Specimen: Nasal Mucosa; Nasal Swab  Result Value Ref Range Status   MRSA, PCR POSITIVE (A) NEGATIVE Final    Comment: RESULT CALLED TO, READ BACK BY AND VERIFIED WITH: A PEREZ,RN 11/16/18 0518 RHOLMES    Staphylococcus aureus POSITIVE (A) NEGATIVE Final    Comment: (NOTE) The Xpert SA Assay (FDA approved for NASAL specimens in patients 35 years of age and older), is one component of a comprehensive surveillance program. It is not intended to diagnose infection nor to guide or monitor treatment. Performed at [facility], [place] [address] [place], [place] [address]   Culture, blood (routine x 2)     Status: None (Preliminary  result)   Collection Time: 11/16/18  3:53 AM   Specimen: BLOOD RIGHT ARM  Result Value Ref Range Status   Specimen Description   Final    BLOOD RIGHT ARM Performed at [facility], [address] [place], [place] [address]    Special Requests   Final    AEB Blood Culture adequate volume Performed at [facility], [address] [place], [place] [address]    Culture   Final    NO GROWTH 2  DAYS Performed at [facility], [address] [place], [place] [address]    Report Status PENDING  Incomplete  Culture, blood (routine x 2)     Status: None (Preliminary result)   Collection Time: 11/16/18  3:53 AM   Specimen: BLOOD RIGHT HAND  Result Value Ref Range Status   Specimen Description   Final    BLOOD RIGHT HAND Performed at [facility], [address] [place], [place] [address]    Special Requests   Final    AEB Blood Culture adequate volume Performed at [facility], [address] [place], [place] [address]    Culture   Final    NO GROWTH 2 DAYS Performed at [facility], [address] [place], [place] [address]    Report Status PENDING  Incomplete  Aerobic/Anaerobic Culture (surgical/deep wound)     Status: None (Preliminary result)   Collection Time: 11/16/18  9:55 AM   Specimen: PATH Other; Tissue  Result Value Ref Range Status   Specimen Description   Final    TISSUE Performed at [facility], [address] [place], [place] [address]    Special Requests   Final    NONE Performed at [facility], [address] [place], [place] [address]    Gram Stain   Final    RARE WBC PRESENT,BOTH PMN AND MONONUCLEAR FEW GRAM POSITIVE COCCI IN PAIRS IN CLUSTERS Performed at [facility], [address] [place], [place] [address]    Culture   Final    ABUNDANT METHICILLIN RESISTANT STAPHYLOCOCCUS AUREUS NO ANAEROBES ISOLATED; CULTURE IN PROGRESS FOR 5 DAYS    Report Status PENDING  Incomplete   Organism ID, Bacteria METHICILLIN RESISTANT STAPHYLOCOCCUS AUREUS  Final      Susceptibility   Methicillin resistant staphylococcus aureus - MIC*    CIPROFLOXACIN >=8 RESISTANT Resistant     ERYTHROMYCIN >=8 RESISTANT Resistant     GENTAMICIN <=0.5 SENSITIVE Sensitive     OXACILLIN >=4 RESISTANT Resistant     TETRACYCLINE <=1 SENSITIVE Sensitive     VANCOMYCIN <=0.5 SENSITIVE  Sensitive     TRIMETH/SULFA 160 RESISTANT Resistant     CLINDAMYCIN >=8 RESISTANT Resistant     RIFAMPIN <=0.5 SENSITIVE Sensitive     Inducible Clindamycin NEGATIVE Sensitive     * ABUNDANT METHICILLIN RESISTANT STAPHYLOCOCCUS AUREUS    Radiology Reports Ct Maxillofacial W Contrast  Result Date: 11/13/2018 CLINICAL DATA:  Cellulitis of face. Additional history provided: Patient reports temple to right side of face since yesterday, swelling started last night. EXAM: CT MAXILLOFACIAL WITH CONTRAST TECHNIQUE: Multidetector CT imaging of the maxillofacial structures was performed with intravenous contrast. Multiplanar CT image reconstructions were also generated. CONTRAST:  75mL OMNIPAQUE IOHEXOL 300 MG/ML  SOLN COMPARISON:  No pertinent prior studies available for comparison. FINDINGS: Osseous: There are multiple carious teeth. Subtle periapical lucency surrounding the left lower second premolar and first  molar. Orbits: Right periorbital cellulitis. No definite evidence of postseptal extension on the current exam. Sinuses: No significant paranasal sinus disease or mastoid effusion. Soft tissues: There is severe right maxillofacial soft tissue swelling and stranding consistent with cellulitis which extends to the right periorbital region. There is a subtle circumscribed low-attenuation region with peripheral enhancement in the right cheek soft tissues measuring 2.8 x 1.0 x 1.2 cm (AP x TV x CC) (series 3, image 60) (series 7, image 30). This may reflect very early abscess formation and extends anteriorly toward the right cheek skin surface (series 3, image 65). Cellulitis changes also extend to the right perimandibular region and upper right neck. Asymmetric prominence of the right parotid gland may reflect a reactive parotiditis. No appreciable mass or swelling within the nasopharynx, oral cavity, oropharynx, hypopharynx or larynx. Enlarged right cervical chain lymph nodes, likely reactive. An index right  level II lymph node measures 16 mm in short axis Limited intracranial: Unremarkable IMPRESSION: 1. Severe right maxillofacial cellulitis extending to the right periorbital, right perimandibular and right upper neck soft tissues. No evidence of postseptal orbital extension on the current examination. 2. 2.8 cm subtle region of low attenuation with peripheral enhancement in the right cheek soft tissues, as described and which may reflect very early abscess formation. 3. Asymmetric prominence of the right parotid gland, which may reflect reactive parotiditis. Clinical correlation is recommended. 4. Right upper cervical lymphadenopathy, likely reactive. Electronically Signed   By: Jackey Loge   On: 11/13/2018 17:51    Lab Data:  CBC: Recent Labs  Lab 11/13/18 1540 11/14/18 0147 11/15/18 0429 11/16/18 0353 11/17/18 0459 11/18/18 0410  WBC 34.7* 27.7* 20.7* 13.9* 14.2* 12.0*  NEUTROABS 28.9*  --  15.2* 9.0*  --   --   HGB 15.2* 13.9 12.2 12.3 12.1 12.6  HCT 46.5* 42.4 37.5 38.2 37.6 39.3  MCV 89.6 89.8 90.4 92.0 90.6 91.6  PLT 343 323 306 351 414* 417*   Basic Metabolic Panel: Recent Labs  Lab 11/14/18 0147 11/15/18 0429 11/16/18 0353 11/17/18 0459 11/18/18 0410  NA 134* 137 137 141 137  K 3.7 4.2 3.8 4.0 3.7  CL 96* 105 107 108 102  CO2 25 22 21* 23 25  GLUCOSE 162* 152* 125* 134* 129*  BUN 9 5* 6 5* 7  CREATININE 0.50 0.50 0.36* 0.62 0.49  CALCIUM 9.3 9.0 8.9 9.0 9.0  MG  --  2.3 2.0  --   --   PHOS  --  2.5 3.6  --   --    GFR: Estimated Creatinine Clearance: 73 mL/min (by C-G formula based on SCr of 0.49 mg/dL). Liver Function Tests: Recent Labs  Lab 11/13/18 1540 11/15/18 0429 11/16/18 0353  AST ALT ALKPHOS 104 97 87  BILITOT 0.6 0.5 0.9  PROT 9.0* 7.4 7.1  ALBUMIN 4.3 3.3* 2.8*   No results for input(s): LIPASE, AMYLASE in the last 168 hours. No results for input(s): AMMONIA in the last 168 hours. Coagulation Profile: No results for  input(s): INR, PROTIME in the last 168 hours. Cardiac Enzymes: No results for input(s): CKTOTAL, CKMB, CKMBINDEX, TROPONINI in the last 168 hours. BNP (last 3 results) No results for input(s): PROBNP in the last 8760 hours. HbA1C: Recent Labs    11/16/18 0353  HGBA1C 6.1*   CBG: No results for input(s): GLUCAP in the last 168 hours. Lipid Profile: No results for input(s): CHOL, HDL, LDLCALC, TRIG, CHOLHDL, LDLDIRECT in  the last 72 hours. Thyroid Function Tests: No results for input(s): TSH, T4TOTAL, FREET4, T3FREE, THYROIDAB in the last 72 hours. Anemia Panel: No results for input(s): VITAMINB12, FOLATE, FERRITIN, TIBC, IRON, RETICCTPCT in the last 72 hours. Urine analysis:    Component Value Date/Time   COLORURINE YELLOW 11/13/2018 1540   APPEARANCEUR CLEAR 11/13/2018 1540   LABSPEC 1.015 11/13/2018 1540   PHURINE 7.0 11/13/2018 1540   GLUCOSEU NEGATIVE 11/13/2018 1540   HGBUR SMALL (A) 11/13/2018 1540   BILIRUBINUR NEGATIVE 11/13/2018 1540   KETONESUR NEGATIVE 11/13/2018 1540   PROTEINUR NEGATIVE 11/13/2018 1540   NITRITE NEGATIVE 11/13/2018 1540   LEUKOCYTESUR NEGATIVE 11/13/2018 1540     Sumner Kirchman M.D. [facility] 11/18/2018, 5:12 PM  Pager: [phone] Between 7am to 7pm - call Pager - [phone]  After 7pm go to [email or web address] - password [email or web address]  Call night coverage person covering after 7pm

## 2018-11-19 ENCOUNTER — Inpatient Hospital Stay (HOSPITAL_COMMUNITY): Payer: Medicaid - Out of State

## 2018-11-19 ENCOUNTER — Inpatient Hospital Stay (HOSPITAL_COMMUNITY): Payer: Medicaid - Out of State | Admitting: Anesthesiology

## 2018-11-19 ENCOUNTER — Encounter (HOSPITAL_COMMUNITY): Payer: Self-pay

## 2018-11-19 ENCOUNTER — Encounter (HOSPITAL_COMMUNITY)
Admission: EM | Disposition: A | Discharge status: Home / Self Care | Payer: Self-pay | Source: Home / Self Care | Attending: Internal Medicine

## 2018-11-19 DIAGNOSIS — R7881 Bacteremia: Secondary | ICD-10-CM | POA: Diagnosis present

## 2018-11-19 DIAGNOSIS — Q211 Atrial septal defect: Secondary | ICD-10-CM

## 2018-11-19 DIAGNOSIS — R768 Other specified abnormal immunological findings in serum: Secondary | ICD-10-CM

## 2018-11-19 DIAGNOSIS — J4520 Mild intermittent asthma, uncomplicated: Secondary | ICD-10-CM

## 2018-11-19 HISTORY — PX: TEE WITHOUT CARDIOVERSION: SHX5443

## 2018-11-19 LAB — CBC
HCT: 40.9 % (ref 36.0–46.0)
Hemoglobin: 13 g/dL (ref 12.0–15.0)
MCH: 29.5 pg (ref 26.0–34.0)
MCHC: 31.8 g/dL (ref 30.0–36.0)
MCV: 93 fL (ref 80.0–100.0)
Platelets: 441 10*3/uL — ABNORMAL HIGH (ref 150–400)
RBC: 4.4 MIL/uL (ref 3.87–5.11)
RDW: 14.5 % (ref 11.5–15.5)
WBC: 12.2 10*3/uL — ABNORMAL HIGH (ref 4.0–10.5)
nRBC: 0 % (ref 0.0–0.2)

## 2018-11-19 LAB — BASIC METABOLIC PANEL
Anion gap: 10 (ref 5–15)
BUN: 11 mg/dL (ref 6–20)
CO2: 25 mmol/L (ref 22–32)
Calcium: 9.4 mg/dL (ref 8.9–10.3)
Chloride: 100 mmol/L (ref 98–111)
Creatinine, Ser: 0.57 mg/dL (ref 0.44–1.00)
GFR calc Af Amer: >60 mL/min (ref >60)
GFR calc non Af Amer: >60 mL/min (ref >60)
Glucose, Bld: 149 mg/dL — ABNORMAL HIGH (ref 70–99)
Potassium: 4.2 mmol/L (ref 3.5–5.1)
Sodium: 135 mmol/L (ref 135–145)

## 2018-11-19 SURGERY — ECHOCARDIOGRAM, TRANSESOPHAGEAL
Anesthesia: Monitor Anesthesia Care

## 2018-11-19 MED ORDER — PROPOFOL 500 MG/50ML IV EMUL
INTRAVENOUS | Status: DC | PRN
  Administered 2018-11-19: 125 ug/kg/min via INTRAVENOUS

## 2018-11-19 MED ORDER — DEXMEDETOMIDINE HCL 200 MCG/2ML IV SOLN
INTRAVENOUS | Status: DC | PRN
  Administered 2018-11-19: 20 ug via INTRAVENOUS

## 2018-11-19 MED ORDER — BUTAMBEN-TETRACAINE-BENZOCAINE 2-2-14 % EX AERO
INHALATION_SPRAY | CUTANEOUS | Status: DC | PRN
  Administered 2018-11-19: 2 via TOPICAL

## 2018-11-19 MED ORDER — MIDAZOLAM HCL 2 MG/2ML IJ SOLN
INTRAMUSCULAR | Status: DC | PRN
  Administered 2018-11-19: 2 mg via INTRAVENOUS

## 2018-11-19 MED ORDER — LIP MEDEX EX OINT
TOPICAL_OINTMENT | CUTANEOUS | Status: AC
  Administered 2018-11-19: 08:00:00
  Filled 2018-11-19: qty 7

## 2018-11-19 MED ORDER — SODIUM CHLORIDE 0.9 % IV SOLN: INTRAVENOUS | Status: DC

## 2018-11-19 NOTE — Progress Notes (Signed)
Pharmacy Antibiotic Note  Amanda Ray is a 33 y.o. female admitted on 11/13/2018 with cellulitis.  Pharmacy has been consulted for Vancomycin dosing.  Plan: Vancomycin increased to 750mg  q8hr on 10/12 AUC 490, Cmax 33, Cmin 11 Daily SCr  Attempted trough today 10/14, but level d/c by lab interface, patient to [facility] for TEE  Height: 5\' 5"  (165.1 cm) Weight: 100 lb (45.4 kg) IBW/kg (Calculated) : 57  Temp (24hrs), Avg:98.3 F (36.8 C), Min:98 F (36.7 C), Max:98.4 F (36.9 C)  Recent Labs  Lab 11/13/18 1540 11/13/18 1905 11/13/18 2233 11/14/18 0147 11/15/18 0429 11/16/18 0353 11/16/18 2012 11/17/18 0459 11/18/18 0410 11/19/18 0415  WBC 34.7*  --   --  27.7* 20.7* 13.9*  --  14.2* 12.0* 12.2*  CREATININE 0.55  --   --  0.50 0.50 0.36*  --  0.62 0.49 0.57  LATICACIDVEN 2.1* 1.4 1.4 2.2*  --  1.0  --   --   --   --   VANCOTROUGH  --   --   --   --   --   --   --  4*  --   --   VANCOPEAK  --   --   --   --   --   --  16*  --   --   --     Estimated Creatinine Clearance: 73 mL/min (by C-G formula based on SCr of 0.57 mg/dL).    No Known Allergies  Antimicrobials this admission: 10/8 CTX >>10/10 10/8 Vanc >>  Dose changes: 10/11 Peak 16 (2012), Trough 4 (0549)  10/12 Vanc 500mg  q12 > 750mg  q8  Microbiology results: 10/8 BCx: + BCID 10/10 Gpc 1/4 meca+ mrsa--LN 10/10 10/8 UCx: >100K ESBL Ecoli  10/8 SARS-2: Negative 10/10: SA+, MRSA+ 10/11 BCx:    Thank you for allowing pharmacy to be a part of this patient's care.  Minda Ditto 11/19/2018 7:25 AM

## 2018-11-19 NOTE — Anesthesia Postprocedure Evaluation (Signed)
Anesthesia Post Note  Patient: Amanda Ray  Procedure(s) Performed: TRANSESOPHAGEAL ECHOCARDIOGRAM (TEE) (N/A )     Patient location during evaluation: Endoscopy Anesthesia Type: MAC Level of consciousness: awake and alert Pain management: pain level controlled Vital Signs Assessment: post-procedure vital signs reviewed and stable Respiratory status: spontaneous breathing, nonlabored ventilation and respiratory function stable Cardiovascular status: stable and blood pressure returned to baseline Postop Assessment: no apparent nausea or vomiting Anesthetic complications: no    Last Vitals:  Vitals:   11/19/18 1320 11/19/18 1340  BP: 111/70 109/67  Pulse: 61 (!) 56  Resp: 12 (!) 8  Temp:    SpO2: 100% 100%    Last Pain:  Vitals:   11/19/18 1340  TempSrc:   PainSc: 0-No pain                 Lynda Rainwater

## 2018-11-19 NOTE — Anesthesia Procedure Notes (Signed)
Procedure Name: MAC Performed by: Valda Favia, CRNA Pre-anesthesia Checklist: Patient identified, Emergency Drugs available, Suction available, Patient being monitored and Timeout performed Patient Re-evaluated:Patient Re-evaluated prior to induction Oxygen Delivery Method: Nasal cannula Preoxygenation: Pre-oxygenation with 100% oxygen Induction Type: IV induction Airway Equipment and Method: Bite block Placement Confirmation: positive ETCO2 Dental Injury: Teeth and Oropharynx as per pre-operative assessment

## 2018-11-19 NOTE — Progress Notes (Signed)
[facility]  PROGRESS NOTE  Amanda Ray TML:[id number] DOB: 1985/09/27 DOA: 11/13/2018 PCP: Patient, No Pcp Per  Brief History    Amanda Ray is a 33 y.o. 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  In the ED T-max 99, heart rate 140, WBC 34.7, lactic acid 2.1 CT maxillofacial showed severe right-sided maxillofacial area cellulitis extends to the right periorbital/perimandible/right upper neck soft tissue without evidence of post septal/orbital extension.  Evaluated by ophthalmology during hospital course who recommends continuing antibiotics due to no evidence of post septal involvement and no orbital signs.  ENT also evaluated and recommended continue IV antibiotics  A & P     Facial abscess with MRSA bacteremia.  Sepsis physiology now resolved in setting of IV drug use.  TEE negative for vegetation.  ID recommends continuing vancomycin while inpatient in relation to doxycycline 100 mg twice daily for additional 7 days.  Status post I&D for facial abscess, cultures also growing MRSA, continue doxycycline treatment   Polysubstance abuse, ongoing IV drug use.  Last heroin use on day of admission, injecting her neck.  Currently on clonidine detox protocol, no active opioid withdrawal   Tobacco abuse.  Continue nicotine patch, counseled smoking cessation during hospital stay    Protein calorie malnutrition, moderate.  BMI 16.6 related due to acute illness and ongoing polysubstance abuse.  Nutrition recommended prostat   HCV screening.  Antibiotic positive.  Will check RNA to determine if active disease     DVT prophylaxis: Lovenox Code Status: Full Family Communication: No family at bedside Disposition Plan: Now the TEE is negative can transition to oral antibiotics pending stable  clinical status     [facility] Direct contact: see [email or web address] (further directions at bottom of note if needed) 7PM-7AM contact night coverage as at bottom of note 11/19/2018, 6:37 PM  LOS: 6 days   Consultants   Dentistry, cardiology, ID, ENT, ophthalmology  Procedures     Antibiotics   Vancomycin  Interval History/Subjective  Feeling well Denies any changes in vision  Objective   Vitals:  Vitals:   11/19/18 1649 11/19/18 1749  BP: 92/64 (!) 93/56  Pulse: 80 74  Resp: 16   Temp: 97.6 F (36.4 C) 97.9 F (36.6 C)  SpO2: 98% 97%    Exam:  Awake Alert, Oriented X 3, No new F.N deficits, Normal affect, thin female Redness below right eye with dressing in place ,No JVD, No cervical lymphadenopathy appriciated.  Symmetrical Chest wall movement, Good air movement bilaterally, CTAB RRR,No Gallops,Rubs or new Murmurs, No Parasternal Heave +ve B.Sounds, Abd Soft, No tenderness, No organomegaly appriciated, No rebound - guarding or rigidity. No Cyanosis, Clubbing or edema, No new Rash or bruise    I have personally reviewed the following:   Data Reviewed: Basic Metabolic Panel: Recent Labs  Lab 11/15/18 0429 11/16/18 0353 11/17/18 0459 11/18/18 0410 11/19/18 0415  NA 137 137 141 137 135  K 4.2 3.8 4.0 3.7 4.2  CL 105 107 108 102 100  CO2 22 21* 23 25 25   GLUCOSE 152* 125* 134* 129* 149*  BUN 5* 6 5* 7 11  CREATININE 0.50 0.36* 0.62 0.49 0.57  CALCIUM 9.0 8.9 9.0 9.0 9.4  MG 2.3 2.0  --   --   --   PHOS 2.5 3.6  --   --   --  Liver Function Tests: Recent Labs  Lab 11/13/18 1540 11/15/18 0429 11/16/18 0353  AST ALT ALKPHOS 104 97 87  BILITOT 0.6 0.5 0.9  PROT 9.0* 7.4 7.1  ALBUMIN 4.3 3.3* 2.8*   No results for input(s): LIPASE, AMYLASE in the last 168 hours. No results for input(s): AMMONIA in the last 168 hours. CBC: Recent Labs  Lab 11/13/18 1540  11/15/18 0429 11/16/18 0353 11/17/18 0459 11/18/18 0410  11/19/18 0415  WBC 34.7*   < > 20.7* 13.9* 14.2* 12.0* 12.2*  NEUTROABS 28.9*  --  15.2* 9.0*  --   --   --   HGB 15.2*   < > 12.2 12.3 12.1 12.6 13.0  HCT 46.5*   < > 37.5 38.2 37.6 39.3 40.9  MCV 89.6   < > 90.4 92.0 90.6 91.6 93.0  PLT 343   < > 306 351 414* 417* 441*   < > = values in this interval not displayed.   Cardiac Enzymes: No results for input(s): CKTOTAL, CKMB, CKMBINDEX, TROPONINI in the last 168 hours. BNP (last 3 results) No results for input(s): BNP in the last 8760 hours.  ProBNP (last 3 results) No results for input(s): PROBNP in the last 8760 hours.  CBG: No results for input(s): GLUCAP in the last 168 hours.  Recent Results (from the past 240 hour(s))  Blood Culture (routine x 2)     Status: Abnormal   Collection Time: 11/13/18  4:08 PM   Specimen: BLOOD  Result Value Ref Range Status   Specimen Description   Final    BLOOD LEFT ANTECUBITAL Performed at [facility], [address], [place], [place] [address]    Special Requests   Final    BOTTLES DRAWN AEROBIC AND ANAEROBIC Blood Culture results may not be optimal due to an excessive volume of blood received in culture bottles Performed at [facility], [address], [place], [place] [address]    Culture  Setup Time   Final    GRAM POSITIVE COCCI IN CLUSTERS ANAEROBIC BOTTLE ONLY Organism ID to follow CRITICAL RESULT CALLED TO, READ BACK BY AND VERIFIED WITHTrixie Deis, AT 0981 11/15/18 BY D.VANHOOK Performed at [facility], [address], [place], [place] [address]    Culture METHICILLIN RESISTANT STAPHYLOCOCCUS AUREUS (A)  Final   Report Status 11/17/2018 FINAL  Final   Organism ID, Bacteria METHICILLIN RESISTANT STAPHYLOCOCCUS AUREUS  Final      Susceptibility   Methicillin resistant staphylococcus aureus - MIC*    CIPROFLOXACIN >=8 RESISTANT Resistant     ERYTHROMYCIN >=8 RESISTANT Resistant     GENTAMICIN <=0.5 SENSITIVE Sensitive      OXACILLIN >=4 RESISTANT Resistant     TETRACYCLINE <=1 SENSITIVE Sensitive     VANCOMYCIN <=0.5 SENSITIVE Sensitive     TRIMETH/SULFA 160 RESISTANT Resistant     CLINDAMYCIN >=8 RESISTANT Resistant     RIFAMPIN <=0.5 SENSITIVE Sensitive     Inducible Clindamycin NEGATIVE Sensitive     * METHICILLIN RESISTANT STAPHYLOCOCCUS AUREUS  Blood Culture ID Panel (Reflexed)     Status: Abnormal   Collection Time: 11/13/18  4:08 PM  Result Value Ref Range Status   Enterococcus species NOT DETECTED NOT DETECTED Final   Listeria monocytogenes NOT DETECTED NOT DETECTED Final   Staphylococcus species DETECTED (A) NOT DETECTED Final    Comment: CRITICAL RESULT CALLED TO, READ BACK BY AND VERIFIED WITH: J. Illene Bolus  PHARMD, AT 1610 11/15/18 BY D. VANHOOK    Staphylococcus aureus (BCID) DETECTED (A) NOT DETECTED Final    Comment: Methicillin (oxacillin)-resistant Staphylococcus aureus (MRSA). MRSA is predictably resistant to beta-lactam antibiotics (except ceftaroline). Preferred therapy is vancomycin unless clinically contraindicated. Patient requires contact precautions if  hospitalized. CRITICAL RESULT CALLED TO, READ BACK BY AND VERIFIED WITH: Peggyann Juba PHARMD, AT 9604 11/15/18 BY D. VANHOOK    Methicillin resistance DETECTED (A) NOT DETECTED Final    Comment: CRITICAL RESULT CALLED TO, READ BACK BY AND VERIFIED WITH: Peggyann Juba PHARMD, AT 5409 11/15/18 BY D. VANHOOK    Streptococcus species NOT DETECTED NOT DETECTED Final   Streptococcus agalactiae NOT DETECTED NOT DETECTED Final   Streptococcus pneumoniae NOT DETECTED NOT DETECTED Final   Streptococcus pyogenes NOT DETECTED NOT DETECTED Final   Acinetobacter baumannii NOT DETECTED NOT DETECTED Final   Enterobacteriaceae species NOT DETECTED NOT DETECTED Final   Enterobacter cloacae complex NOT DETECTED NOT DETECTED Final   Escherichia coli NOT DETECTED NOT DETECTED Final   Klebsiella oxytoca NOT DETECTED NOT DETECTED Final   Klebsiella  pneumoniae NOT DETECTED NOT DETECTED Final   Proteus species NOT DETECTED NOT DETECTED Final   Serratia marcescens NOT DETECTED NOT DETECTED Final   Haemophilus influenzae NOT DETECTED NOT DETECTED Final   Neisseria meningitidis NOT DETECTED NOT DETECTED Final   Pseudomonas aeruginosa NOT DETECTED NOT DETECTED Final   Candida albicans NOT DETECTED NOT DETECTED Final   Candida glabrata NOT DETECTED NOT DETECTED Final   Candida krusei NOT DETECTED NOT DETECTED Final   Candida parapsilosis NOT DETECTED NOT DETECTED Final   Candida tropicalis NOT DETECTED NOT DETECTED Final    Comment: Performed at [facility], [address], [place], [place] [address]  Urine culture     Status: Abnormal   Collection Time: 11/13/18  4:11 PM   Specimen: In/Out Cath Urine  Result Value Ref Range Status   Specimen Description   Final    IN/OUT CATH URINE Performed at [facility], [address] [place] [address] [place], [place] [address]    Special Requests   Final    NONE Performed at [facility], [address], [place], [place] [address]    Culture (A)  Final    >=100,000 COLONIES/mL ESCHERICHIA COLI Confirmed Extended Spectrum Beta-Lactamase Producer (ESBL).  In bloodstream infections from ESBL organisms, carbapenems are preferred over piperacillin/tazobactam. They are shown to have a lower risk of mortality. 10,000 COLONIES/mL GROUP B STREP(S.AGALACTIAE)ISOLATED TESTING AGAINST S. AGALACTIAE NOT ROUTINELY PERFORMED DUE TO PREDICTABILITY OF AMP/PEN/VAN SUSCEPTIBILITY. Performed at [facility], [address] [place], [place] [address]    Report Status 11/15/2018 FINAL  Final   Organism ID, Bacteria ESCHERICHIA COLI (A)  Final      Susceptibility   Escherichia coli - MIC*    AMPICILLIN >=32 RESISTANT Resistant     CEFAZOLIN >=64 RESISTANT Resistant     CEFTRIAXONE >=64 RESISTANT Resistant     CIPROFLOXACIN <=0.25 SENSITIVE Sensitive     GENTAMICIN <=1  SENSITIVE Sensitive     IMIPENEM <=0.25 SENSITIVE Sensitive     NITROFURANTOIN 32 SENSITIVE Sensitive     TRIMETH/SULFA >=320 RESISTANT Resistant     AMPICILLIN/SULBACTAM 8 SENSITIVE Sensitive     PIP/TAZO <=4 SENSITIVE Sensitive     Extended ESBL POSITIVE Resistant     * >=100,000 COLONIES/mL ESCHERICHIA COLI  Blood Culture (routine x 2)     Status: None   Collection Time: 11/13/18  4:36 PM   Specimen: BLOOD RIGHT FOREARM  Result Value Ref Range Status   Specimen Description   Final    BLOOD RIGHT FOREARM Performed at [facility], [address], [place], [place] [address]    Special Requests   Final    BOTTLES DRAWN AEROBIC ONLY Blood Culture results may not be optimal due to an inadequate volume of blood received in culture bottles Performed at [facility], [address], [place], [place] [address]    Culture   Final    NO GROWTH 5 DAYS Performed at [facility], [address] [place], [place] [address]    Report Status 11/18/2018 FINAL  Final  SARS Coronavirus 2 by RT PCR (hospital order, performed in [facility] hospital lab) Nasopharyngeal Nasopharyngeal Swab     Status: None   Collection Time: 11/13/18  6:44 PM   Specimen: Nasopharyngeal Swab  Result Value Ref Range Status   SARS Coronavirus 2 NEGATIVE NEGATIVE Final    Comment: (NOTE) If result is NEGATIVE SARS-CoV-2 target nucleic acids are NOT DETECTED. The SARS-CoV-2 RNA is generally detectable in upper and lower  respiratory specimens during the acute phase of infection. The lowest  concentration of SARS-CoV-2 viral copies this assay can detect is 250  copies / mL. A negative result does not preclude SARS-CoV-2 infection  and should not be used as the sole basis for treatment or other  patient management decisions.  A negative result may occur with  improper specimen collection / handling, submission of specimen other  than nasopharyngeal swab, presence of viral mutation(s)  within the  areas targeted by this assay, and inadequate number of viral copies  (<250 copies / mL). A negative result must be combined with clinical  observations, patient history, and epidemiological information. If result is POSITIVE SARS-CoV-2 target nucleic acids are DETECTED. The SARS-CoV-2 RNA is generally detectable in upper and lower  respiratory specimens dur ing the acute phase of infection.  Positive  results are indicative of active infection with SARS-CoV-2.  Clinical  correlation with patient history and other diagnostic information is  necessary to determine patient infection status.  Positive results do  not rule out bacterial infection or co-infection with other viruses. If result is PRESUMPTIVE POSTIVE SARS-CoV-2 nucleic acids MAY BE PRESENT.   A presumptive positive result was obtained on the submitted specimen  and confirmed on repeat testing.  While 2019 novel coronavirus  (SARS-CoV-2) nucleic acids may be present in the submitted sample  additional confirmatory testing may be necessary for epidemiological  and / or clinical management purposes  to differentiate between  SARS-CoV-2 and other Sarbecovirus currently known to infect humans.  If clinically indicated additional testing with an alternate test  methodology [phone]) is advised. The SARS-CoV-2 RNA is generally  detectable in upper and lower respiratory sp ecimens during the acute  phase of infection. The expected result is Negative. Fact Sheet for Patients:  BoilerBrush.com.cy Fact Sheet for Healthcare Providers: https://pope.com/ This test is not yet approved or cleared by the [place] FDA and has been authorized for detection and/or diagnosis of SARS-CoV-2 by FDA under an Emergency Use Authorization (EUA).  This EUA will remain in effect (meaning this test can be used) for the duration of the COVID-19 declaration under Section 564(b)(1) of the Act,  21 U.S.C. section 360bbb-3(b)(1), unless the authorization is terminated or revoked sooner. Performed at [facility], [address] [place], [place] [address]  Surgical pcr screen     Status: Abnormal   Collection Time: 11/15/18  8:25 PM   Specimen: Nasal Mucosa; Nasal Swab  Result Value Ref Range Status   MRSA, PCR POSITIVE (A) NEGATIVE Final    Comment: RESULT CALLED TO, READ BACK BY AND VERIFIED WITH: A PEREZ,RN 11/16/18 0518 RHOLMES    Staphylococcus aureus POSITIVE (A) NEGATIVE Final    Comment: (NOTE) The Xpert SA Assay (FDA approved for NASAL specimens in patients 46 years of age and older), is one component of a comprehensive surveillance program. It is not intended to diagnose infection nor to guide or monitor treatment. Performed at [facility], [address], [place], [place] [address]   Culture, blood (routine x 2)     Status: None (Preliminary result)   Collection Time: 11/16/18  3:53 AM   Specimen: BLOOD RIGHT ARM  Result Value Ref Range Status   Specimen Description BLOOD RIGHT ARM  Final   Special Requests   Final    Blood Culture adequate volume BOTTLES DRAWN AEROBIC ONLY   Culture   Final    NO GROWTH 2 DAYS Performed at [facility], [address] [place], [place] [address]    Report Status PENDING  Incomplete  Culture, blood (routine x 2)     Status: None (Preliminary result)   Collection Time: 11/16/18  3:53 AM   Specimen: BLOOD RIGHT HAND  Result Value Ref Range Status   Specimen Description BLOOD RIGHT HAND  Final   Special Requests   Final    BOTTLES DRAWN AEROBIC ONLY Blood Culture adequate volume   Culture   Final    NO GROWTH 2 DAYS Performed at [facility], [address], [place], [place] [address]    Report Status PENDING  Incomplete  Aerobic/Anaerobic Culture (surgical/deep wound)     Status: None (Preliminary result)   Collection Time: 11/16/18  9:55 AM   Specimen: PATH Other;  Tissue  Result Value Ref Range Status   Specimen Description   Final    TISSUE Performed at [facility], [address] [place], [place] [address]    Special Requests   Final    NONE Performed at [facility], [address], [place], [place] [address]    Gram Stain   Final    RARE WBC PRESENT,BOTH PMN AND MONONUCLEAR FEW GRAM POSITIVE COCCI IN PAIRS IN CLUSTERS Performed at [facility], [address] [place], [place] [address]    Culture   Final    ABUNDANT METHICILLIN RESISTANT STAPHYLOCOCCUS AUREUS NO ANAEROBES ISOLATED; CULTURE IN PROGRESS FOR 5 DAYS    Report Status PENDING  Incomplete   Organism ID, Bacteria METHICILLIN RESISTANT STAPHYLOCOCCUS AUREUS  Final      Susceptibility   Methicillin resistant staphylococcus aureus - MIC*    CIPROFLOXACIN >=8 RESISTANT Resistant     ERYTHROMYCIN >=8 RESISTANT Resistant     GENTAMICIN <=0.5 SENSITIVE Sensitive     OXACILLIN >=4 RESISTANT Resistant     TETRACYCLINE <=1 SENSITIVE Sensitive     VANCOMYCIN <=0.5 SENSITIVE Sensitive     TRIMETH/SULFA 160 RESISTANT Resistant     CLINDAMYCIN >=8 RESISTANT Resistant     RIFAMPIN <=0.5 SENSITIVE Sensitive     Inducible Clindamycin NEGATIVE Sensitive     * ABUNDANT METHICILLIN RESISTANT STAPHYLOCOCCUS AUREUS     Studies: No results found.  Scheduled Meds:  cloNIDine  0.1 mg Oral QAC breakfast  enoxaparin (LOVENOX) injection  40 mg Subcutaneous Q24H   feeding supplement (ENSURE ENLIVE)  237 mL Oral BID BM   feeding supplement (PRO-STAT SUGAR FREE 64)  30 mL Oral BID   multivitamin with minerals  1 tablet Oral Daily   nicotine  14 mg Transdermal Daily   Continuous Infusions:  vancomycin 750 mg (11/19/18 0514)    Active Problems:   Facial cellulitis   Bacteremia      Amanda Ray  [facility]

## 2018-11-19 NOTE — Transfer of Care (Signed)
Immediate Anesthesia Transfer of Care Note  Patient: Amanda Ray  Procedure(s) Performed: TRANSESOPHAGEAL ECHOCARDIOGRAM (TEE) (N/A )  Patient Location: Endoscopy Unit  Anesthesia Type:MAC  Level of Consciousness: drowsy  Airway & Oxygen Therapy: Patient Spontanous Breathing and Patient connected to nasal cannula oxygen  Post-op Assessment: Report given to RN and Post -op Vital signs reviewed and stable  Post vital signs: Reviewed and stable  Last Vitals:  Vitals Value Taken Time  BP    Temp    Pulse 61 11/19/18 1306  Resp 13 11/19/18 1306  SpO2 100 % 11/19/18 1306  Vitals shown include unvalidated device data.  Last Pain:  Vitals:   11/19/18 1105  TempSrc: Temporal  PainSc: 0-No pain      Patients Stated Pain Goal: 2 (24/09/73 5329)  Complications: No apparent anesthesia complications

## 2018-11-19 NOTE — Progress Notes (Signed)
  Echocardiogram Echocardiogram Transesophageal has been performed.  Darlina Sicilian M 11/19/2018, 1:19 PM

## 2018-11-19 NOTE — Interval H&P Note (Signed)
History and Physical Interval Note:  11/19/2018 11:48 AM  Amanda Ray  has presented today for surgery, with the diagnosis of BACTEREMIA.  The various methods of treatment have been discussed with the patient and family. After consideration of risks, benefits and other options for treatment, the patient has consented to  Procedure(s): TRANSESOPHAGEAL ECHOCARDIOGRAM (TEE) (N/A) as a surgical intervention.  The patient's history has been reviewed, patient examined, no change in status, stable for surgery.  I have reviewed the patient's chart and labs.  Questions were answered to the patient's satisfaction.     [name]

## 2018-11-19 NOTE — CV Procedure (Signed)
    TRANSESOPHAGEAL ECHOCARDIOGRAM   NAME:  Amanda Ray   MRN: [id number] DOB:  Jun 03, 1985   ADMIT DATE: 11/13/2018  INDICATIONS: Bacteremia  PROCEDURE:   Informed consent was obtained prior to the procedure. The risks, benefits and alternatives for the procedure were discussed and the patient comprehended these risks.  Risks include, but are not limited to, cough, sore throat, vomiting, nausea, somnolence, esophageal and stomach trauma or perforation, bleeding, low blood pressure, aspiration, pneumonia, infection, trauma to the teeth and death.    Procedural time out performed. The oropharynx was anesthetized with topical 1% benzocaine.    Anesthesia was administered by Edmonia James CRNA and Dr Sabra Heck  The transesophageal probe was inserted in the esophagus and stomach without difficulty and multiple views were obtained.    COMPLICATIONS:    There were no immediate complications.  FINDINGS:  LEFT VENTRICLE: EF = 60-65%. No regional wall motion abnormalities.  RIGHT VENTRICLE: Normal size and function.   LEFT ATRIUM: No thrombus/mass.  LEFT ATRIAL APPENDAGE: No thrombus/mass.   RIGHT ATRIUM: No thrombus/mass.  AORTIC VALVE:  Trileaflet. No regurgitation. No vegetation.  MITRAL VALVE:    Normal structure. Trace regurgitation. No vegetation.  TRICUSPID VALVE: Normal structure. No regurgitation. No vegetation.  PULMONIC VALVE: Grossly normal structure. Trace regurgitation. No apparent vegetation.  INTERATRIAL SEPTUM: PFO seen by color Doppler.  PERICARDIUM: No effusion noted.  DESCENDING AORTA: No plaque seen   CONCLUSION: No vegetation seen.  Small PFO  [facility]  [address] [place], [place] [address] [phone]   4:07 PM

## 2018-11-19 NOTE — Progress Notes (Signed)
[facility]   Reason for visit: Follow up on bacteremia  Interval History: repeat blood cultures remain ngtd day 3.  Much improved overall.  TEE results with no vegetation.  WBC remains stable at 12.2.   Vancomycin day 7  Physical Exam: Constitutional:  Vitals:   11/19/18 1501 11/19/18 1551  BP: 93/69 (!) 91/59  Pulse: 68 70  Resp: 18   Temp: 97.6 F (36.4 C) (!) 97.5 F (36.4 C)  SpO2: 98% 98%   patient appears in NAD HENT: right facial swelling much improved with bandage over it.  Respiratory: Normal respiratory effort; CTA B Cardiovascular: RRR  Review of Systems: Gastrointestinal: negative for nausea and diarrhea Integument/breast: negative for rash  Lab Results  Component Value Date   WBC 12.2 (H) 11/19/2018   HGB 13.0 11/19/2018   HCT 40.9 11/19/2018   MCV 93.0 11/19/2018   PLT 441 (H) 11/19/2018    Lab Results  Component Value Date   CREATININE 0.57 11/19/2018   BUN 11 11/19/2018   NA 135 11/19/2018   K 4.2 11/19/2018   CL 100 11/19/2018   CO2 25 11/19/2018    Lab Results  Component Value Date   ALT 19 11/16/2018   AST 18 11/16/2018   ALKPHOS 87 11/16/2018     Microbiology: Recent Results (from the past 240 hour(s))  Blood Culture (routine x 2)     Status: Abnormal   Collection Time: 11/13/18  4:08 PM   Specimen: BLOOD  Result Value Ref Range Status   Specimen Description   Final    BLOOD LEFT ANTECUBITAL Performed at [facility], [address], [place], [place] [address]    Special Requests   Final    BOTTLES DRAWN AEROBIC AND ANAEROBIC Blood Culture results may not be optimal due to an excessive volume of blood received in culture bottles Performed at [facility], [address] [place], [place] [address]    Culture  Setup Time   Final    GRAM POSITIVE COCCI IN CLUSTERS ANAEROBIC BOTTLE ONLY Organism ID to follow CRITICAL RESULT CALLED TO, READ BACK BY AND VERIFIED  WITHTrixie Deis, AT 0981 11/15/18 BY D.VANHOOK Performed at [facility], [address] [place], [place] [address]    Culture METHICILLIN RESISTANT STAPHYLOCOCCUS AUREUS (A)  Final   Report Status 11/17/2018 FINAL  Final   Organism ID, Bacteria METHICILLIN RESISTANT STAPHYLOCOCCUS AUREUS  Final      Susceptibility   Methicillin resistant staphylococcus aureus - MIC*    CIPROFLOXACIN >=8 RESISTANT Resistant     ERYTHROMYCIN >=8 RESISTANT Resistant     GENTAMICIN <=0.5 SENSITIVE Sensitive     OXACILLIN >=4 RESISTANT Resistant     TETRACYCLINE <=1 SENSITIVE Sensitive     VANCOMYCIN <=0.5 SENSITIVE Sensitive     TRIMETH/SULFA 160 RESISTANT Resistant     CLINDAMYCIN >=8 RESISTANT Resistant     RIFAMPIN <=0.5 SENSITIVE Sensitive     Inducible Clindamycin NEGATIVE Sensitive     * METHICILLIN RESISTANT STAPHYLOCOCCUS AUREUS  Blood Culture ID Panel (Reflexed)     Status: Abnormal   Collection Time: 11/13/18  4:08 PM  Result Value Ref Range Status   Enterococcus species NOT DETECTED NOT DETECTED Final   Listeria monocytogenes NOT DETECTED NOT DETECTED Final   Staphylococcus species DETECTED (A) NOT DETECTED Final    Comment: CRITICAL RESULT CALLED TO, READ BACK BY AND VERIFIED WITHPeggyann Juba PHARMD, AT [phone] 11/15/18  BY D. VANHOOK    Staphylococcus aureus (BCID) DETECTED (A) NOT DETECTED Final    Comment: Methicillin (oxacillin)-resistant Staphylococcus aureus (MRSA). MRSA is predictably resistant to beta-lactam antibiotics (except ceftaroline). Preferred therapy is vancomycin unless clinically contraindicated. Patient requires contact precautions if  hospitalized. CRITICAL RESULT CALLED TO, READ BACK BY AND VERIFIED WITH: Peggyann Juba PHARMD, AT 1610 11/15/18 BY D. VANHOOK    Methicillin resistance DETECTED (A) NOT DETECTED Final    Comment: CRITICAL RESULT CALLED TO, READ BACK BY AND VERIFIED WITH: Peggyann Juba PHARMD, AT 9604 11/15/18 BY D. VANHOOK    Streptococcus  species NOT DETECTED NOT DETECTED Final   Streptococcus agalactiae NOT DETECTED NOT DETECTED Final   Streptococcus pneumoniae NOT DETECTED NOT DETECTED Final   Streptococcus pyogenes NOT DETECTED NOT DETECTED Final   Acinetobacter baumannii NOT DETECTED NOT DETECTED Final   Enterobacteriaceae species NOT DETECTED NOT DETECTED Final   Enterobacter cloacae complex NOT DETECTED NOT DETECTED Final   Escherichia coli NOT DETECTED NOT DETECTED Final   Klebsiella oxytoca NOT DETECTED NOT DETECTED Final   Klebsiella pneumoniae NOT DETECTED NOT DETECTED Final   Proteus species NOT DETECTED NOT DETECTED Final   Serratia marcescens NOT DETECTED NOT DETECTED Final   Haemophilus influenzae NOT DETECTED NOT DETECTED Final   Neisseria meningitidis NOT DETECTED NOT DETECTED Final   Pseudomonas aeruginosa NOT DETECTED NOT DETECTED Final   Candida albicans NOT DETECTED NOT DETECTED Final   Candida glabrata NOT DETECTED NOT DETECTED Final   Candida krusei NOT DETECTED NOT DETECTED Final   Candida parapsilosis NOT DETECTED NOT DETECTED Final   Candida tropicalis NOT DETECTED NOT DETECTED Final    Comment: Performed at [facility], [address], [place], [place] [address]  Urine culture     Status: Abnormal   Collection Time: 11/13/18  4:11 PM   Specimen: In/Out Cath Urine  Result Value Ref Range Status   Specimen Description   Final    IN/OUT CATH URINE Performed at [facility], [address] [place], [place] [address]    Special Requests   Final    NONE Performed at [facility], [address] [place], [place] [address]    Culture (A)  Final    >=100,000 COLONIES/mL ESCHERICHIA COLI Confirmed Extended Spectrum Beta-Lactamase Producer (ESBL).  In bloodstream infections from ESBL organisms, carbapenems are preferred over piperacillin/tazobactam. They are shown to have a lower risk of mortality. 10,000 COLONIES/mL GROUP B STREP(S.AGALACTIAE)ISOLATED  TESTING AGAINST S. AGALACTIAE NOT ROUTINELY PERFORMED DUE TO PREDICTABILITY OF AMP/PEN/VAN SUSCEPTIBILITY. Performed at [facility], [address] [place], [place] [address]    Report Status 11/15/2018 FINAL  Final   Organism ID, Bacteria ESCHERICHIA COLI (A)  Final      Susceptibility   Escherichia coli - MIC*    AMPICILLIN >=32 RESISTANT Resistant     CEFAZOLIN >=64 RESISTANT Resistant     CEFTRIAXONE >=64 RESISTANT Resistant     CIPROFLOXACIN <=0.25 SENSITIVE Sensitive     GENTAMICIN <=1 SENSITIVE Sensitive     IMIPENEM <=0.25 SENSITIVE Sensitive     NITROFURANTOIN 32 SENSITIVE Sensitive     TRIMETH/SULFA >=320 RESISTANT Resistant     AMPICILLIN/SULBACTAM 8 SENSITIVE Sensitive     PIP/TAZO <=4 SENSITIVE Sensitive     Extended ESBL POSITIVE Resistant     * >=100,000 COLONIES/mL ESCHERICHIA COLI  Blood Culture (routine x 2)     Status: None   Collection Time: 11/13/18  4:36 PM  Specimen: BLOOD RIGHT FOREARM  Result Value Ref Range Status   Specimen Description   Final    BLOOD RIGHT FOREARM Performed at [facility], [address] [place], [place] [address]    Special Requests   Final    BOTTLES DRAWN AEROBIC ONLY Blood Culture results may not be optimal due to an inadequate volume of blood received in culture bottles Performed at [facility], [address], [place], [place] [address]    Culture   Final    NO GROWTH 5 DAYS Performed at [facility], [address], [place], [place] [address]    Report Status 11/18/2018 FINAL  Final  SARS Coronavirus 2 by RT PCR (hospital order, performed in [facility] hospital lab) Nasopharyngeal Nasopharyngeal Swab     Status: None   Collection Time: 11/13/18  6:44 PM   Specimen: Nasopharyngeal Swab  Result Value Ref Range Status   SARS Coronavirus 2 NEGATIVE NEGATIVE Final    Comment: (NOTE) If result is NEGATIVE SARS-CoV-2 target nucleic acids are NOT DETECTED. The SARS-CoV-2 RNA is  generally detectable in upper and lower  respiratory specimens during the acute phase of infection. The lowest  concentration of SARS-CoV-2 viral copies this assay can detect is 250  copies / mL. A negative result does not preclude SARS-CoV-2 infection  and should not be used as the sole basis for treatment or other  patient management decisions.  A negative result may occur with  improper specimen collection / handling, submission of specimen other  than nasopharyngeal swab, presence of viral mutation(s) within the  areas targeted by this assay, and inadequate number of viral copies  (<250 copies / mL). A negative result must be combined with clinical  observations, patient history, and epidemiological information. If result is POSITIVE SARS-CoV-2 target nucleic acids are DETECTED. The SARS-CoV-2 RNA is generally detectable in upper and lower  respiratory specimens dur ing the acute phase of infection.  Positive  results are indicative of active infection with SARS-CoV-2.  Clinical  correlation with patient history and other diagnostic information is  necessary to determine patient infection status.  Positive results do  not rule out bacterial infection or co-infection with other viruses. If result is PRESUMPTIVE POSTIVE SARS-CoV-2 nucleic acids MAY BE PRESENT.   A presumptive positive result was obtained on the submitted specimen  and confirmed on repeat testing.  While 2019 novel coronavirus  (SARS-CoV-2) nucleic acids may be present in the submitted sample  additional confirmatory testing may be necessary for epidemiological  and / or clinical management purposes  to differentiate between  SARS-CoV-2 and other Sarbecovirus currently known to infect humans.  If clinically indicated additional testing with an alternate test  methodology [phone]) is advised. The SARS-CoV-2 RNA is generally  detectable in upper and lower respiratory sp ecimens during the acute  phase of infection.  The expected result is Negative. Fact Sheet for Patients:  BoilerBrush.com.cy Fact Sheet for Healthcare Providers: https://pope.com/ This test is not yet approved or cleared by the [place] FDA and has been authorized for detection and/or diagnosis of SARS-CoV-2 by FDA under an Emergency Use Authorization (EUA).  This EUA will remain in effect (meaning this test can be used) for the duration of the COVID-19 declaration under Section 564(b)(1) of the Act, 21 U.S.C. section 360bbb-3(b)(1), unless the authorization is terminated or revoked sooner. Performed at [facility], [address], [place], [place] [address]   Surgical pcr screen  Status: Abnormal   Collection Time: 11/15/18  8:25 PM   Specimen: Nasal Mucosa; Nasal Swab  Result Value Ref Range Status   MRSA, PCR POSITIVE (A) NEGATIVE Final    Comment: RESULT CALLED TO, READ BACK BY AND VERIFIED WITH: A PEREZ,RN 11/16/18 0518 RHOLMES    Staphylococcus aureus POSITIVE (A) NEGATIVE Final    Comment: (NOTE) The Xpert SA Assay (FDA approved for NASAL specimens in patients 64 years of age and older), is one component of a comprehensive surveillance program. It is not intended to diagnose infection nor to guide or monitor treatment. Performed at [facility], [place] [address], [place], [place] [address]   Culture, blood (routine x 2)     Status: None (Preliminary result)   Collection Time: 11/16/18  3:53 AM   Specimen: BLOOD RIGHT ARM  Result Value Ref Range Status   Specimen Description BLOOD RIGHT ARM  Final   Special Requests   Final    Blood Culture adequate volume BOTTLES DRAWN AEROBIC ONLY   Culture   Final    NO GROWTH 2 DAYS Performed at [facility], [place] [address], [place], [place] [address]    Report Status PENDING  Incomplete  Culture, blood (routine x 2)     Status: None (Preliminary result)   Collection Time:  11/16/18  3:53 AM   Specimen: BLOOD RIGHT HAND  Result Value Ref Range Status   Specimen Description BLOOD RIGHT HAND  Final   Special Requests   Final    BOTTLES DRAWN AEROBIC ONLY Blood Culture adequate volume   Culture   Final    NO GROWTH 2 DAYS Performed at [facility], [place] [address] [place], [place] [address]    Report Status PENDING  Incomplete  Aerobic/Anaerobic Culture (surgical/deep wound)     Status: None (Preliminary result)   Collection Time: 11/16/18  9:55 AM   Specimen: PATH Other; Tissue  Result Value Ref Range Status   Specimen Description   Final    TISSUE Performed at [place] [address] [place], [place] [address]    Special Requests   Final    NONE Performed at [facility], [place] [address], [place], [place] [address]    Gram Stain   Final    RARE WBC PRESENT,BOTH PMN AND MONONUCLEAR FEW GRAM POSITIVE COCCI IN PAIRS IN CLUSTERS Performed at [facility], [place] [address], [place], [place] [address]    Culture   Final    ABUNDANT METHICILLIN RESISTANT STAPHYLOCOCCUS AUREUS NO ANAEROBES ISOLATED; CULTURE IN PROGRESS FOR 5 DAYS    Report Status PENDING  Incomplete   Organism ID, Bacteria METHICILLIN RESISTANT STAPHYLOCOCCUS AUREUS  Final      Susceptibility   Methicillin resistant staphylococcus aureus - MIC*    CIPROFLOXACIN >=8 RESISTANT Resistant     ERYTHROMYCIN >=8 RESISTANT Resistant     GENTAMICIN <=0.5 SENSITIVE Sensitive     OXACILLIN >=4 RESISTANT Resistant     TETRACYCLINE <=1 SENSITIVE Sensitive     VANCOMYCIN <=0.5 SENSITIVE Sensitive     TRIMETH/SULFA 160 RESISTANT Resistant     CLINDAMYCIN >=8 RESISTANT Resistant     RIFAMPIN <=0.5 SENSITIVE Sensitive     Inducible Clindamycin NEGATIVE Sensitive     * ABUNDANT METHICILLIN RESISTANT STAPHYLOCOCCUS AUREUS    Impression/Plan:  1. MRSA bacteremia - only 1 bottle and now TEE negative for vegetation.  Has been on vancomycin 7 days  and repeat  culture negative.  Continue vancomycin while inpatient  2.  Facial abscess - s/p I and D.  Cultures also with MRSA.  I would treat for about 7 more days with doxycycline 100 mg twice a day  3.  Screening - Ab positive.  Will check RNA in the am and in follow up can consider treatment at that time if RNA is positive.    I will sign off.  She can follow up with me on 10/26 at 11 am.  thanks

## 2018-11-19 NOTE — Anesthesia Preprocedure Evaluation (Signed)
Anesthesia Evaluation  Patient identified by MRN, date of birth, ID band Patient awake    Reviewed: Allergy & Precautions, H&P , NPO status , Patient's Chart, lab work & pertinent test results  Airway Mallampati: II  TM Distance: >3 FB Neck ROM: Full    Dental no notable dental hx. (+) Poor Dentition, Dental Advisory Given   Pulmonary asthma , Current Smoker and Patient abstained from smoking.,    Pulmonary exam normal breath sounds clear to auscultation       Cardiovascular Exercise Tolerance: Good negative cardio ROS   Rhythm:Regular Rate:Normal     Neuro/Psych negative neurological ROS  negative psych ROS   GI/Hepatic negative GI ROS, (+)     substance abuse  IV drug use,   Endo/Other  negative endocrine ROS  Renal/GU negative Renal ROS  negative genitourinary   Musculoskeletal  (+) narcotic dependent  Abdominal   Peds  Hematology negative hematology ROS (+)   Anesthesia Other Findings   Reproductive/Obstetrics negative OB ROS                             Anesthesia Physical  Anesthesia Plan  ASA: III  Anesthesia Plan: MAC   Post-op Pain Management:    Induction: Intravenous  PONV Risk Score and Plan: 1 and Ondansetron and Treatment may vary due to age or medical condition  Airway Management Planned: Nasal Cannula  Additional Equipment:   Intra-op Plan:   Post-operative Plan:   Informed Consent: I have reviewed the patients History and Physical, chart, labs and discussed the procedure including the risks, benefits and alternatives for the proposed anesthesia with the patient or authorized representative who has indicated his/her understanding and acceptance.     Dental advisory given  Plan Discussed with: CRNA  Anesthesia Plan Comments:         Anesthesia Quick Evaluation

## 2018-11-20 ENCOUNTER — Encounter (HOSPITAL_COMMUNITY): Payer: Self-pay | Admitting: Cardiology

## 2018-11-20 DIAGNOSIS — A4902 Methicillin resistant Staphylococcus aureus infection, unspecified site: Secondary | ICD-10-CM

## 2018-11-20 DIAGNOSIS — J45998 Other asthma: Secondary | ICD-10-CM

## 2018-11-20 LAB — CBC
HCT: 41.2 % (ref 36.0–46.0)
Hemoglobin: 12.9 g/dL (ref 12.0–15.0)
MCH: 29.3 pg (ref 26.0–34.0)
MCHC: 31.3 g/dL (ref 30.0–36.0)
MCV: 93.6 fL (ref 80.0–100.0)
Platelets: 438 10*3/uL — ABNORMAL HIGH (ref 150–400)
RBC: 4.4 MIL/uL (ref 3.87–5.11)
RDW: 14.6 % (ref 11.5–15.5)
WBC: 8.7 10*3/uL (ref 4.0–10.5)
nRBC: 0 % (ref 0.0–0.2)

## 2018-11-20 LAB — CREATININE, SERUM
Creatinine, Ser: 0.48 mg/dL (ref 0.44–1.00)
GFR calc Af Amer: >60 mL/min (ref >60)
GFR calc non Af Amer: >60 mL/min (ref >60)

## 2018-11-20 MED ORDER — DOXYCYCLINE MONOHYDRATE 100 MG PO TABS: 100.0000 mg | ORAL_TABLET | Freq: Two times a day (BID) | ORAL | 0 refills | Status: AC

## 2018-11-20 NOTE — Plan of Care (Signed)
Patient up in room this morning; pain controlled better than previous. No concerns noted at this time. Will continue to monitor.

## 2018-11-20 NOTE — Discharge Summary (Signed)
Amanda Ray WUJ:[id number] DOB: [birth year] DOA: 11/13/2018  PCP: Patient, No Pcp Per  Admit date: 11/13/2018 Discharge date: 11/20/2018  Admitted From: Home Disposition: Home  Recommendations for Outpatient Follow-up:  1. Follow up with PCP in 1-2 weeks. Follow up with dentistry for wound check in 1 week, ID follow-up arranged. 2. New medications: Doxycycline 100 mg twice daily x7 days 3. Please follow up on the following pending results:  Home Health: No Equipment/Devices: None  Discharge Condition: Stable CODE STATUS: Full Diet recommendation: Regular  Brief/Interim Summary: History of present illness:  Amanda Ray is a 33 y.o. 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  In the ED T-max 99, heart rate 140, WBC 34.7, lactic acid 2.1 CT maxillofacial showed severe right-sided maxillofacial area cellulitis extends to the right periorbital/perimandible/right upper neck soft tissue wit doxycycline 100 g twice daily x7 days hout evidence of post septal/orbital extension. Remaining hospital course addressed in problem based format below:   Hospital Course:    Facial abscess with MRSA bacteremia likely secondary to carious tooth in setting of IVDU.  CT of face consistent with right-sided maxillofacial cellulitis with extension into periorbital/premandible upper neck soft tissue.  Evaluated by ENT and ophthalmology we do not find evidence of post septal involvement and no orbital signs.  Patient was continued on IV antibiotics.  Due to 1 of 2 blood cultures growing MRSA patient underwent TTE and TEE which were both negative for vegetation.  Likely risk factors include IV drug use and tooth decay/ chipped tooth, status post teeth extraction and I and D by dentistry.  Patient was transitioned from  vancomycin to doxycycline per ID recommendations to continue for additional 7 days post discharge .  Follow-up arranged with dentistry as well for wound check   Polysubstance abuse, ongoing IV drug use.  Last heroin use on day of admission, injecting her neck.  clonidine detox protocol during stay with no active opioid withdrawal   Tobacco abuse.  Continue nicotine patch, counseled smoking cessation during hospital stay    Protein calorie malnutrition, moderate.  BMI 16.6 related due to acute illness and ongoing polysubstance abuse.  Nutrition recommended prostat   HCV screening.  Antibiotic positive.   RNA pending,  ID to follow up in case needs treatment   Consultations:  ENT, ID, Dentistry, Ophthalmology, Cardiology  I &D of facial abscess and teeth numbers 4 and 19 extraction by Dentristry, 10/12  Procedures/Studies: TEE, 10/14  Subjective:  Discharge Exam: Vitals:   11/20/18 0955 11/20/18 1325  BP: (!) 97/56 106/65  Pulse: 96 98  Resp:    Temp: 98.2 F (36.8 C) 98.3 F (36.8 C)  SpO2: 99% 98%   Vitals:   11/20/18 0034 11/20/18 0643 11/20/18 0955 11/20/18 1325  BP: (!) 98/55 (!) 98/56 (!) 97/56 106/65  Pulse: 75 91 96 98  Resp: 16 16    Temp: 98 F (36.7 C) 98.3 F (36.8 C) 98.2 F (36.8 C) 98.3 F (36.8 C)  TempSrc: Oral Oral Oral Oral  SpO2: 99% 98% 99% 98%  Weight:      Height:        General: thin female, Lying in bed, no apparent distress Eyes: EOMI, anicteric HEENT: EOMI, anicteric, normal vision, dressing in place on right cheek with mild erythema Cardiovascular: regular rate and rhythm, no murmurs, rubs or gallops,  no edema, Respiratory: Normal respiratory effort, lungs clear to auscultation bilaterally Abdomen: soft, non-distended, non-tender, normal bowel sounds Skin: No Rash Neurologic: Grossly no focal neuro deficit.Mental status AAOx3, speech normal, Psychiatric:Appropriate affect, and mood  Discharge Diagnoses:  Active  Problems:   Facial cellulitis   Chronic asthma   Polysubstance abuse (HCC)   Bacteremia    Discharge Instructions  Discharge Instructions    Diet - low sodium heart healthy   Complete by: As directed    Increase activity slowly   Complete by: As directed      Allergies as of 11/20/2018   No Known Allergies     Medication List    TAKE these medications   doxycycline 100 MG tablet Commonly known as: ADOXA Take 1 tablet (100 mg total) by mouth 2 (two) times daily for 7 days.      Follow-up Information    Ocie Doyne, DDS. Call in 1 week.   Specialty: Oral Surgery Why: For wound re-check Contact information: [address] [place] [address] [phone]        [facility]. Schedule an appointment as soon as possible for a visit.   Why: call [phone] to make a hospital follow up appt and to get a primary care physician. Contact information: [address] [place] [address] [phone]         No Known Allergies      The results of significant diagnostics from this hospitalization (including imaging, microbiology, ancillary and laboratory) are listed below for reference.     Microbiology: Recent Results (from the past 240 hour(s))  Blood Culture (routine x 2)     Status: Abnormal   Collection Time: 11/13/18  4:08 PM   Specimen: BLOOD  Result Value Ref Range Status   Specimen Description   Final    BLOOD LEFT ANTECUBITAL Performed at [facility], [address], [place], [place] [address]    Special Requests   Final    BOTTLES DRAWN AEROBIC AND ANAEROBIC Blood Culture results may not be optimal due to an excessive volume of blood received in culture bottles Performed at [facility], [address] [place], [place] [address]    Culture  Setup Time   Final    GRAM POSITIVE COCCI IN CLUSTERS ANAEROBIC BOTTLE ONLY Organism ID to follow CRITICAL  RESULT CALLED TO, READ BACK BY AND VERIFIED WITHTrixie Deis, AT 5366 11/15/18 BY D.VANHOOK Performed at [facility], [address], [place], [place] [address]    Culture METHICILLIN RESISTANT STAPHYLOCOCCUS AUREUS (A)  Final   Report Status 11/17/2018 FINAL  Final   Organism ID, Bacteria METHICILLIN RESISTANT STAPHYLOCOCCUS AUREUS  Final      Susceptibility   Methicillin resistant staphylococcus aureus - MIC*    CIPROFLOXACIN >=8 RESISTANT Resistant     ERYTHROMYCIN >=8 RESISTANT Resistant     GENTAMICIN <=0.5 SENSITIVE Sensitive     OXACILLIN >=4 RESISTANT Resistant     TETRACYCLINE <=1 SENSITIVE Sensitive     VANCOMYCIN <=0.5 SENSITIVE Sensitive     TRIMETH/SULFA 160 RESISTANT Resistant     CLINDAMYCIN >=8 RESISTANT Resistant     RIFAMPIN <=0.5 SENSITIVE Sensitive     Inducible Clindamycin NEGATIVE Sensitive     * METHICILLIN RESISTANT STAPHYLOCOCCUS AUREUS  Blood Culture ID Panel (Reflexed)     Status: Abnormal   Collection Time: 11/13/18  4:08 PM  Result Value Ref Range Status  Enterococcus species NOT DETECTED NOT DETECTED Final   Listeria monocytogenes NOT DETECTED NOT DETECTED Final   Staphylococcus species DETECTED (A) NOT DETECTED Final    Comment: CRITICAL RESULT CALLED TO, READ BACK BY AND VERIFIED WITH: Peggyann Juba PHARMD, AT 0981 11/15/18 BY D. VANHOOK    Staphylococcus aureus (BCID) DETECTED (A) NOT DETECTED Final    Comment: Methicillin (oxacillin)-resistant Staphylococcus aureus (MRSA). MRSA is predictably resistant to beta-lactam antibiotics (except ceftaroline). Preferred therapy is vancomycin unless clinically contraindicated. Patient requires contact precautions if  hospitalized. CRITICAL RESULT CALLED TO, READ BACK BY AND VERIFIED WITH: Peggyann Juba PHARMD, AT 1914 11/15/18 BY D. VANHOOK    Methicillin resistance DETECTED (A) NOT DETECTED Final    Comment: CRITICAL RESULT CALLED TO, READ BACK BY AND VERIFIED WITH: Peggyann Juba PHARMD, AT 7829  11/15/18 BY D. VANHOOK    Streptococcus species NOT DETECTED NOT DETECTED Final   Streptococcus agalactiae NOT DETECTED NOT DETECTED Final   Streptococcus pneumoniae NOT DETECTED NOT DETECTED Final   Streptococcus pyogenes NOT DETECTED NOT DETECTED Final   Acinetobacter baumannii NOT DETECTED NOT DETECTED Final   Enterobacteriaceae species NOT DETECTED NOT DETECTED Final   Enterobacter cloacae complex NOT DETECTED NOT DETECTED Final   Escherichia coli NOT DETECTED NOT DETECTED Final   Klebsiella oxytoca NOT DETECTED NOT DETECTED Final   Klebsiella pneumoniae NOT DETECTED NOT DETECTED Final   Proteus species NOT DETECTED NOT DETECTED Final   Serratia marcescens NOT DETECTED NOT DETECTED Final   Haemophilus influenzae NOT DETECTED NOT DETECTED Final   Neisseria meningitidis NOT DETECTED NOT DETECTED Final   Pseudomonas aeruginosa NOT DETECTED NOT DETECTED Final   Candida albicans NOT DETECTED NOT DETECTED Final   Candida glabrata NOT DETECTED NOT DETECTED Final   Candida krusei NOT DETECTED NOT DETECTED Final   Candida parapsilosis NOT DETECTED NOT DETECTED Final   Candida tropicalis NOT DETECTED NOT DETECTED Final    Comment: Performed at [facility], [address] [place], [place] [address]  Urine culture     Status: Abnormal   Collection Time: 11/13/18  4:11 PM   Specimen: In/Out Cath Urine  Result Value Ref Range Status   Specimen Description   Final    IN/OUT CATH URINE Performed at [facility], [address], [place], [place] [address]    Special Requests   Final    NONE Performed at [facility], [address] [place], [place] [address]    Culture (A)  Final    >=100,000 COLONIES/mL ESCHERICHIA COLI Confirmed Extended Spectrum Beta-Lactamase Producer (ESBL).  In bloodstream infections from ESBL organisms, carbapenems are preferred over piperacillin/tazobactam. They are shown to have a lower risk of mortality. 10,000  COLONIES/mL GROUP B STREP(S.AGALACTIAE)ISOLATED TESTING AGAINST S. AGALACTIAE NOT ROUTINELY PERFORMED DUE TO PREDICTABILITY OF AMP/PEN/VAN SUSCEPTIBILITY. Performed at [facility], [address], [place], [place] [address]    Report Status 11/15/2018 FINAL  Final   Organism ID, Bacteria ESCHERICHIA COLI (A)  Final      Susceptibility   Escherichia coli - MIC*    AMPICILLIN >=32 RESISTANT Resistant     CEFAZOLIN >=64 RESISTANT Resistant     CEFTRIAXONE >=64 RESISTANT Resistant     CIPROFLOXACIN <=0.25 SENSITIVE Sensitive     GENTAMICIN <=1 SENSITIVE Sensitive     IMIPENEM <=0.25 SENSITIVE Sensitive     NITROFURANTOIN 32 SENSITIVE Sensitive     TRIMETH/SULFA >=320 RESISTANT Resistant     AMPICILLIN/SULBACTAM 8 SENSITIVE Sensitive  PIP/TAZO <=4 SENSITIVE Sensitive     Extended ESBL POSITIVE Resistant     * >=100,000 COLONIES/mL ESCHERICHIA COLI  Blood Culture (routine x 2)     Status: None   Collection Time: 11/13/18  4:36 PM   Specimen: BLOOD RIGHT FOREARM  Result Value Ref Range Status   Specimen Description   Final    BLOOD RIGHT FOREARM Performed at [facility], [place] [address] [place], [place] [address]    Special Requests   Final    BOTTLES DRAWN AEROBIC ONLY Blood Culture results may not be optimal due to an inadequate volume of blood received in culture bottles Performed at [place] [address], [place], [place] [address]    Culture   Final    NO GROWTH 5 DAYS Performed at [facility], [place] [address], [place], [place] [address]    Report Status 11/18/2018 FINAL  Final  SARS Coronavirus 2 by RT PCR (hospital order, performed in [facility] hospital lab) Nasopharyngeal Nasopharyngeal Swab     Status: None   Collection Time: 11/13/18  6:44 PM   Specimen: Nasopharyngeal Swab  Result Value Ref Range Status   SARS Coronavirus 2 NEGATIVE NEGATIVE Final    Comment: (NOTE) If result is NEGATIVE SARS-CoV-2 target nucleic acids  are NOT DETECTED. The SARS-CoV-2 RNA is generally detectable in upper and lower  respiratory specimens during the acute phase of infection. The lowest  concentration of SARS-CoV-2 viral copies this assay can detect is 250  copies / mL. A negative result does not preclude SARS-CoV-2 infection  and should not be used as the sole basis for treatment or other  patient management decisions.  A negative result may occur with  improper specimen collection / handling, submission of specimen other  than nasopharyngeal swab, presence of viral mutation(s) within the  areas targeted by this assay, and inadequate number of viral copies  (<250 copies / mL). A negative result must be combined with clinical  observations, patient history, and epidemiological information. If result is POSITIVE SARS-CoV-2 target nucleic acids are DETECTED. The SARS-CoV-2 RNA is generally detectable in upper and lower  respiratory specimens dur ing the acute phase of infection.  Positive  results are indicative of active infection with SARS-CoV-2.  Clinical  correlation with patient history and other diagnostic information is  necessary to determine patient infection status.  Positive results do  not rule out bacterial infection or co-infection with other viruses. If result is PRESUMPTIVE POSTIVE SARS-CoV-2 nucleic acids MAY BE PRESENT.   A presumptive positive result was obtained on the submitted specimen  and confirmed on repeat testing.  While 2019 novel coronavirus  (SARS-CoV-2) nucleic acids may be present in the submitted sample  additional confirmatory testing may be necessary for epidemiological  and / or clinical management purposes  to differentiate between  SARS-CoV-2 and other Sarbecovirus currently known to infect humans.  If clinically indicated additional testing with an alternate test  methodology [phone]) is advised. The SARS-CoV-2 RNA is generally  detectable in upper and lower respiratory sp ecimens  during the acute  phase of infection. The expected result is Negative. Fact Sheet for Patients:  StrictlyIdeas.no Fact Sheet for Healthcare Providers: BankingDealers.co.za This test is not yet approved or cleared by the [place] FDA and has been authorized for detection and/or diagnosis of SARS-CoV-2 by FDA under an Emergency Use Authorization (EUA).  This EUA will remain in effect (meaning this test can be used) for  the duration of the COVID-19 declaration under Section 564(b)(1) of the Act, 21 U.S.C. section 360bbb-3(b)(1), unless the authorization is terminated or revoked sooner. Performed at [facility], [address] [place], [place] [address]   Surgical pcr screen     Status: Abnormal   Collection Time: 11/15/18  8:25 PM   Specimen: Nasal Mucosa; Nasal Swab  Result Value Ref Range Status   MRSA, PCR POSITIVE (A) NEGATIVE Final    Comment: RESULT CALLED TO, READ BACK BY AND VERIFIED WITH: A PEREZ,RN 11/16/18 0518 RHOLMES    Staphylococcus aureus POSITIVE (A) NEGATIVE Final    Comment: (NOTE) The Xpert SA Assay (FDA approved for NASAL specimens in patients 77 years of age and older), is one component of a comprehensive surveillance program. It is not intended to diagnose infection nor to guide or monitor treatment. Performed at [facility], [address], [place], [place] [address]   Culture, blood (routine x 2)     Status: None (Preliminary result)   Collection Time: 11/16/18  3:53 AM   Specimen: BLOOD RIGHT ARM  Result Value Ref Range Status   Specimen Description BLOOD RIGHT ARM  Final   Special Requests   Final    Blood Culture adequate volume BOTTLES DRAWN AEROBIC ONLY   Culture   Final    NO GROWTH 4 DAYS Performed at [facility], [address], [place], [place] [address]    Report Status PENDING  Incomplete  Culture, blood (routine x 2)     Status: None  (Preliminary result)   Collection Time: 11/16/18  3:53 AM   Specimen: BLOOD RIGHT HAND  Result Value Ref Range Status   Specimen Description BLOOD RIGHT HAND  Final   Special Requests   Final    BOTTLES DRAWN AEROBIC ONLY Blood Culture adequate volume   Culture   Final    NO GROWTH 4 DAYS Performed at [facility], [address] [place], [place] [address]    Report Status PENDING  Incomplete  Aerobic/Anaerobic Culture (surgical/deep wound)     Status: None (Preliminary result)   Collection Time: 11/16/18  9:55 AM   Specimen: PATH Other; Tissue  Result Value Ref Range Status   Specimen Description   Final    TISSUE Performed at [facility], [address], [place], [place] [address]    Special Requests   Final    NONE Performed at [facility], [address], [place], [place] [address]    Gram Stain   Final    RARE WBC PRESENT,BOTH PMN AND MONONUCLEAR FEW GRAM POSITIVE COCCI IN PAIRS IN CLUSTERS Performed at [facility], [address] [place], [place] [address]    Culture   Final    ABUNDANT METHICILLIN RESISTANT STAPHYLOCOCCUS AUREUS NO ANAEROBES ISOLATED; CULTURE IN PROGRESS FOR 5 DAYS    Report Status PENDING  Incomplete   Organism ID, Bacteria METHICILLIN RESISTANT STAPHYLOCOCCUS AUREUS  Final      Susceptibility   Methicillin resistant staphylococcus aureus - MIC*    CIPROFLOXACIN >=8 RESISTANT Resistant     ERYTHROMYCIN >=8 RESISTANT Resistant     GENTAMICIN <=0.5 SENSITIVE Sensitive     OXACILLIN >=4 RESISTANT Resistant     TETRACYCLINE <=1 SENSITIVE Sensitive     VANCOMYCIN <=0.5 SENSITIVE Sensitive     TRIMETH/SULFA 160 RESISTANT Resistant     CLINDAMYCIN >=8 RESISTANT Resistant     RIFAMPIN <=0.5 SENSITIVE Sensitive  Inducible Clindamycin NEGATIVE Sensitive     * ABUNDANT METHICILLIN RESISTANT STAPHYLOCOCCUS AUREUS     Labs: BNP (last 3 results) No results for input(s): BNP in the last 8760  hours. Basic Metabolic Panel: Recent Labs  Lab 11/15/18 0429 11/16/18 0353 11/17/18 0459 11/18/18 0410 11/19/18 0415 11/20/18 0335  NA 137 137 141 137 135  --   K 4.2 3.8 4.0 3.7 4.2  --   CL 105 107 108 102 100  --   CO2 22 21* --   GLUCOSE 152* 125* 134* 129* 149*  --   BUN 5* 6 5* 7 11  --   CREATININE 0.50 0.36* 0.62 0.49 0.57 0.48  CALCIUM 9.0 8.9 9.0 9.0 9.4  --   MG 2.3 2.0  --   --   --   --   PHOS 2.5 3.6  --   --   --   --    Liver Function Tests: Recent Labs  Lab 11/15/18 0429 11/16/18 0353  AST 30 18  ALT 23 19  ALKPHOS 97 87  BILITOT 0.5 0.9  PROT 7.4 7.1  ALBUMIN 3.3* 2.8*   No results for input(s): LIPASE, AMYLASE in the last 168 hours. No results for input(s): AMMONIA in the last 168 hours. CBC: Recent Labs  Lab 11/15/18 0429 11/16/18 0353 11/17/18 0459 11/18/18 0410 11/19/18 0415 11/20/18 0335  WBC 20.7* 13.9* 14.2* 12.0* 12.2* 8.7  NEUTROABS 15.2* 9.0*  --   --   --   --   HGB 12.2 12.3 12.1 12.6 13.0 12.9  HCT 37.5 38.2 37.6 39.3 40.9 41.2  MCV 90.4 92.0 90.6 91.6 93.0 93.6  PLT 306 351 414* 417* 441* 438*   Cardiac Enzymes: No results for input(s): CKTOTAL, CKMB, CKMBINDEX, TROPONINI in the last 168 hours. BNP: Invalid input(s): POCBNP CBG: No results for input(s): GLUCAP in the last 168 hours. D-Dimer No results for input(s): DDIMER in the last 72 hours. Hgb A1c No results for input(s): HGBA1C in the last 72 hours. Lipid Profile No results for input(s): CHOL, HDL, LDLCALC, TRIG, CHOLHDL, LDLDIRECT in the last 72 hours. Thyroid function studies No results for input(s): TSH, T4TOTAL, T3FREE, THYROIDAB in the last 72 hours.  Invalid input(s): FREET3 Anemia work up No results for input(s): VITAMINB12, FOLATE, FERRITIN, TIBC, IRON, RETICCTPCT in the last 72 hours. Urinalysis    Component Value Date/Time   COLORURINE YELLOW 11/13/2018 1540   APPEARANCEUR CLEAR 11/13/2018 1540   LABSPEC 1.015 11/13/2018 1540   PHURINE  7.0 11/13/2018 1540   GLUCOSEU NEGATIVE 11/13/2018 1540   HGBUR SMALL (A) 11/13/2018 1540   BILIRUBINUR NEGATIVE 11/13/2018 1540   KETONESUR NEGATIVE 11/13/2018 1540   PROTEINUR NEGATIVE 11/13/2018 1540   NITRITE NEGATIVE 11/13/2018 1540   LEUKOCYTESUR NEGATIVE 11/13/2018 1540   Sepsis Labs Invalid input(s): PROCALCITONIN,  WBC,  LACTICIDVEN Microbiology Recent Results (from the past 240 hour(s))  Blood Culture (routine x 2)     Status: Abnormal   Collection Time: 11/13/18  4:08 PM   Specimen: BLOOD  Result Value Ref Range Status   Specimen Description   Final    BLOOD LEFT ANTECUBITAL Performed at [facility], [address] [place], [place] [address]    Special Requests   Final    BOTTLES DRAWN AEROBIC AND ANAEROBIC Blood Culture results may not be optimal due to an excessive volume of blood received in culture bottles Performed at [facility], [address]  [place], [place] [address]    Culture  Setup Time   Final    GRAM POSITIVE COCCI IN CLUSTERS ANAEROBIC BOTTLE ONLY Organism ID to follow CRITICAL RESULT CALLED TO, READ BACK BY AND VERIFIED WITH: Trixie Deis, AT 6578 11/15/18 BY D.VANHOOK Performed at [facility], [address] [place], [place] [address]    Culture METHICILLIN RESISTANT STAPHYLOCOCCUS AUREUS (A)  Final   Report Status 11/17/2018 FINAL  Final   Organism ID, Bacteria METHICILLIN RESISTANT STAPHYLOCOCCUS AUREUS  Final      Susceptibility   Methicillin resistant staphylococcus aureus - MIC*    CIPROFLOXACIN >=8 RESISTANT Resistant     ERYTHROMYCIN >=8 RESISTANT Resistant     GENTAMICIN <=0.5 SENSITIVE Sensitive     OXACILLIN >=4 RESISTANT Resistant     TETRACYCLINE <=1 SENSITIVE Sensitive     VANCOMYCIN <=0.5 SENSITIVE Sensitive     TRIMETH/SULFA 160 RESISTANT Resistant     CLINDAMYCIN >=8 RESISTANT Resistant     RIFAMPIN <=0.5 SENSITIVE Sensitive     Inducible Clindamycin NEGATIVE Sensitive      * METHICILLIN RESISTANT STAPHYLOCOCCUS AUREUS  Blood Culture ID Panel (Reflexed)     Status: Abnormal   Collection Time: 11/13/18  4:08 PM  Result Value Ref Range Status   Enterococcus species NOT DETECTED NOT DETECTED Final   Listeria monocytogenes NOT DETECTED NOT DETECTED Final   Staphylococcus species DETECTED (A) NOT DETECTED Final    Comment: CRITICAL RESULT CALLED TO, READ BACK BY AND VERIFIED WITH: Peggyann Juba PHARMD, AT 9528 11/15/18 BY D. VANHOOK    Staphylococcus aureus (BCID) DETECTED (A) NOT DETECTED Final    Comment: Methicillin (oxacillin)-resistant Staphylococcus aureus (MRSA). MRSA is predictably resistant to beta-lactam antibiotics (except ceftaroline). Preferred therapy is vancomycin unless clinically contraindicated. Patient requires contact precautions if  hospitalized. CRITICAL RESULT CALLED TO, READ BACK BY AND VERIFIED WITH: Peggyann Juba PHARMD, AT 4132 11/15/18 BY D. VANHOOK    Methicillin resistance DETECTED (A) NOT DETECTED Final    Comment: CRITICAL RESULT CALLED TO, READ BACK BY AND VERIFIED WITH: Peggyann Juba PHARMD, AT 4401 11/15/18 BY D. VANHOOK    Streptococcus species NOT DETECTED NOT DETECTED Final   Streptococcus agalactiae NOT DETECTED NOT DETECTED Final   Streptococcus pneumoniae NOT DETECTED NOT DETECTED Final   Streptococcus pyogenes NOT DETECTED NOT DETECTED Final   Acinetobacter baumannii NOT DETECTED NOT DETECTED Final   Enterobacteriaceae species NOT DETECTED NOT DETECTED Final   Enterobacter cloacae complex NOT DETECTED NOT DETECTED Final   Escherichia coli NOT DETECTED NOT DETECTED Final   Klebsiella oxytoca NOT DETECTED NOT DETECTED Final   Klebsiella pneumoniae NOT DETECTED NOT DETECTED Final   Proteus species NOT DETECTED NOT DETECTED Final   Serratia marcescens NOT DETECTED NOT DETECTED Final   Haemophilus influenzae NOT DETECTED NOT DETECTED Final   Neisseria meningitidis NOT DETECTED NOT DETECTED Final   Pseudomonas aeruginosa  NOT DETECTED NOT DETECTED Final   Candida albicans NOT DETECTED NOT DETECTED Final   Candida glabrata NOT DETECTED NOT DETECTED Final   Candida krusei NOT DETECTED NOT DETECTED Final   Candida parapsilosis NOT DETECTED NOT DETECTED Final   Candida tropicalis NOT DETECTED NOT DETECTED Final    Comment: Performed at [facility], [address] [place], [place] [address]  Urine culture     Status: Abnormal   Collection Time: 11/13/18  4:11 PM   Specimen: In/Out Cath Urine  Result Value Ref Range Status   Specimen Description   Final  IN/OUT CATH URINE Performed at [facility], [address], [place], [place] [address]    Special Requests   Final    NONE Performed at [facility], [address], [place], [place] [address]    Culture (A)  Final    >=100,000 COLONIES/mL ESCHERICHIA COLI Confirmed Extended Spectrum Beta-Lactamase Producer (ESBL).  In bloodstream infections from ESBL organisms, carbapenems are preferred over piperacillin/tazobactam. They are shown to have a lower risk of mortality. 10,000 COLONIES/mL GROUP B STREP(S.AGALACTIAE)ISOLATED TESTING AGAINST S. AGALACTIAE NOT ROUTINELY PERFORMED DUE TO PREDICTABILITY OF AMP/PEN/VAN SUSCEPTIBILITY. Performed at [facility], [address], [place], [place] [address]    Report Status 11/15/2018 FINAL  Final   Organism ID, Bacteria ESCHERICHIA COLI (A)  Final      Susceptibility   Escherichia coli - MIC*    AMPICILLIN >=32 RESISTANT Resistant     CEFAZOLIN >=64 RESISTANT Resistant     CEFTRIAXONE >=64 RESISTANT Resistant     CIPROFLOXACIN <=0.25 SENSITIVE Sensitive     GENTAMICIN <=1 SENSITIVE Sensitive     IMIPENEM <=0.25 SENSITIVE Sensitive     NITROFURANTOIN 32 SENSITIVE Sensitive     TRIMETH/SULFA >=320 RESISTANT Resistant     AMPICILLIN/SULBACTAM 8 SENSITIVE Sensitive     PIP/TAZO <=4 SENSITIVE Sensitive     Extended ESBL POSITIVE Resistant     * >=100,000  COLONIES/mL ESCHERICHIA COLI  Blood Culture (routine x 2)     Status: None   Collection Time: 11/13/18  4:36 PM   Specimen: BLOOD RIGHT FOREARM  Result Value Ref Range Status   Specimen Description   Final    BLOOD RIGHT FOREARM Performed at [facility], [address], [place], [place] [address]    Special Requests   Final    BOTTLES DRAWN AEROBIC ONLY Blood Culture results may not be optimal due to an inadequate volume of blood received in culture bottles Performed at [facility], [address], [place], [place] [address]    Culture   Final    NO GROWTH 5 DAYS Performed at [facility], [address] [place], [place] [address]    Report Status 11/18/2018 FINAL  Final  SARS Coronavirus 2 by RT PCR (hospital order, performed in [facility] hospital lab) Nasopharyngeal Nasopharyngeal Swab     Status: None   Collection Time: 11/13/18  6:44 PM   Specimen: Nasopharyngeal Swab  Result Value Ref Range Status   SARS Coronavirus 2 NEGATIVE NEGATIVE Final    Comment: (NOTE) If result is NEGATIVE SARS-CoV-2 target nucleic acids are NOT DETECTED. The SARS-CoV-2 RNA is generally detectable in upper and lower  respiratory specimens during the acute phase of infection. The lowest  concentration of SARS-CoV-2 viral copies this assay can detect is 250  copies / mL. A negative result does not preclude SARS-CoV-2 infection  and should not be used as the sole basis for treatment or other  patient management decisions.  A negative result may occur with  improper specimen collection / handling, submission of specimen other  than nasopharyngeal swab, presence of viral mutation(s) within the  areas targeted by this assay, and inadequate number of viral copies  (<250 copies / mL). A negative result must be combined with clinical  observations, patient history, and epidemiological information. If result is POSITIVE SARS-CoV-2 target nucleic acids are DETECTED. The  SARS-CoV-2 RNA is generally detectable in upper and lower  respiratory specimens dur ing the acute phase  of infection.  Positive  results are indicative of active infection with SARS-CoV-2.  Clinical  correlation with patient history and other diagnostic information is  necessary to determine patient infection status.  Positive results do  not rule out bacterial infection or co-infection with other viruses. If result is PRESUMPTIVE POSTIVE SARS-CoV-2 nucleic acids MAY BE PRESENT.   A presumptive positive result was obtained on the submitted specimen  and confirmed on repeat testing.  While 2019 novel coronavirus  (SARS-CoV-2) nucleic acids may be present in the submitted sample  additional confirmatory testing may be necessary for epidemiological  and / or clinical management purposes  to differentiate between  SARS-CoV-2 and other Sarbecovirus currently known to infect humans.  If clinically indicated additional testing with an alternate test  methodology [phone]([id number]) is advised. The SARS-CoV-2 RNA is generally  detectable in upper and lower respiratory sp ecimens during the acute  phase of infection. The expected result is Negative. Fact Sheet for Patients:  BoilerBrush.com.cyhttps://www.fda.gov/media/136312/download Fact Sheet for Healthcare Providers: https://pope.com/https://www.fda.gov/media/136313/download This test is not yet approved or cleared by the [place] FDA and has been authorized for detection and/or diagnosis of SARS-CoV-2 by FDA under an Emergency Use Authorization (EUA).  This EUA will remain in effect (meaning this test can be used) for the duration of the COVID-19 declaration under Section 564(b)(1) of the Act, 21 U.S.C. section 360bbb-3(b)(1), unless the authorization is terminated or revoked sooner. Performed at [facility], [address] [place], [place] [address]   Surgical pcr screen     Status: Abnormal   Collection Time: 11/15/18  8:25 PM   Specimen: Nasal  Mucosa; Nasal Swab  Result Value Ref Range Status   MRSA, PCR POSITIVE (A) NEGATIVE Final    Comment: RESULT CALLED TO, READ BACK BY AND VERIFIED WITH: A PEREZ,RN 11/16/18 0518 RHOLMES    Staphylococcus aureus POSITIVE (A) NEGATIVE Final    Comment: (NOTE) The Xpert SA Assay (FDA approved for NASAL specimens in patients 31 years of age and older), is one component of a comprehensive surveillance program. It is not intended to diagnose infection nor to guide or monitor treatment. Performed at [facility], [address] [place], [place] [address]   Culture, blood (routine x 2)     Status: None (Preliminary result)   Collection Time: 11/16/18  3:53 AM   Specimen: BLOOD RIGHT ARM  Result Value Ref Range Status   Specimen Description BLOOD RIGHT ARM  Final   Special Requests   Final    Blood Culture adequate volume BOTTLES DRAWN AEROBIC ONLY   Culture   Final    NO GROWTH 4 DAYS Performed at [facility], [address] [place], [place] [address]    Report Status PENDING  Incomplete  Culture, blood (routine x 2)     Status: None (Preliminary result)   Collection Time: 11/16/18  3:53 AM   Specimen: BLOOD RIGHT HAND  Result Value Ref Range Status   Specimen Description BLOOD RIGHT HAND  Final   Special Requests   Final    BOTTLES DRAWN AEROBIC ONLY Blood Culture adequate volume   Culture   Final    NO GROWTH 4 DAYS Performed at [facility], [address] [place], [place] [address]    Report Status PENDING  Incomplete  Aerobic/Anaerobic Culture (surgical/deep wound)     Status: None (Preliminary result)   Collection Time: 11/16/18  9:55 AM   Specimen: PATH Other; Tissue  Result Value Ref Range Status   Specimen Description   Final    TISSUE Performed at [facility], [address], [place], [place] [address]    Special Requests   Final    NONE Performed at [facility], [address],  [place], [place] [address]    Gram Stain   Final    RARE WBC PRESENT,BOTH PMN AND MONONUCLEAR FEW GRAM POSITIVE COCCI IN PAIRS IN CLUSTERS Performed at [facility], [address], [place], [place] [address]    Culture   Final    ABUNDANT METHICILLIN RESISTANT STAPHYLOCOCCUS AUREUS NO ANAEROBES ISOLATED; CULTURE IN PROGRESS FOR 5 DAYS    Report Status PENDING  Incomplete   Organism ID, Bacteria METHICILLIN RESISTANT STAPHYLOCOCCUS AUREUS  Final      Susceptibility   Methicillin resistant staphylococcus aureus - MIC*    CIPROFLOXACIN >=8 RESISTANT Resistant     ERYTHROMYCIN >=8 RESISTANT Resistant     GENTAMICIN <=0.5 SENSITIVE Sensitive     OXACILLIN >=4 RESISTANT Resistant     TETRACYCLINE <=1 SENSITIVE Sensitive     VANCOMYCIN <=0.5 SENSITIVE Sensitive     TRIMETH/SULFA 160 RESISTANT Resistant     CLINDAMYCIN >=8 RESISTANT Resistant     RIFAMPIN <=0.5 SENSITIVE Sensitive     Inducible Clindamycin NEGATIVE Sensitive     * ABUNDANT METHICILLIN RESISTANT STAPHYLOCOCCUS AUREUS     Time coordinating discharge: Over 30 minutes  SIGNED:   Laverna Peace, MD  [facility] 11/20/2018, 9:02 PM Pager   If 7PM-7AM, please contact night-coverage [email or web address] Password [email or web address]

## 2018-11-20 NOTE — Plan of Care (Signed)
Reviewed discharge instructions with patient; copy given. IV removed. Patient ready for discharge.  

## 2018-11-21 LAB — CULTURE, BLOOD (ROUTINE X 2)
Culture: NO GROWTH
Culture: NO GROWTH
Special Requests: ADEQUATE
Special Requests: ADEQUATE

## 2018-11-21 LAB — HCV RNA QUANT RFLX ULTRA OR GENOTYP
HCV RNA Qnt(log copy/mL): UNDETERMINED log10 IU/mL
HepC Qn: NOT DETECTED IU/mL

## 2018-11-21 LAB — AEROBIC/ANAEROBIC CULTURE W GRAM STAIN (SURGICAL/DEEP WOUND)

## 2018-12-01 ENCOUNTER — Inpatient Hospital Stay: Payer: Medicaid - Out of State | Admitting: Internal Medicine

## 2018-12-26 ENCOUNTER — Other Ambulatory Visit: Payer: Self-pay

## 2018-12-26 ENCOUNTER — Emergency Department (HOSPITAL_COMMUNITY): Payer: Medicaid - Out of State

## 2018-12-26 ENCOUNTER — Emergency Department (HOSPITAL_COMMUNITY)
Admission: EM | Admit: 2018-12-26 | Discharge: 2018-12-26 | Disposition: A | Discharge status: Home / Self Care | Payer: Medicaid - Out of State | Attending: Emergency Medicine | Admitting: Emergency Medicine

## 2018-12-26 ENCOUNTER — Encounter (HOSPITAL_COMMUNITY): Payer: Self-pay

## 2018-12-26 DIAGNOSIS — Z20828 Contact with and (suspected) exposure to other viral communicable diseases: Secondary | ICD-10-CM | POA: Diagnosis not present

## 2018-12-26 DIAGNOSIS — R0602 Shortness of breath: Secondary | ICD-10-CM | POA: Diagnosis present

## 2018-12-26 DIAGNOSIS — J4521 Mild intermittent asthma with (acute) exacerbation: Secondary | ICD-10-CM | POA: Insufficient documentation

## 2018-12-26 DIAGNOSIS — F17290 Nicotine dependence, other tobacco product, uncomplicated: Secondary | ICD-10-CM | POA: Insufficient documentation

## 2018-12-26 DIAGNOSIS — Z20822 Contact with and (suspected) exposure to covid-19: Secondary | ICD-10-CM

## 2018-12-26 DIAGNOSIS — R0682 Tachypnea, not elsewhere classified: Secondary | ICD-10-CM | POA: Insufficient documentation

## 2018-12-26 DIAGNOSIS — R05 Cough: Secondary | ICD-10-CM | POA: Insufficient documentation

## 2018-12-26 DIAGNOSIS — R059 Cough, unspecified: Secondary | ICD-10-CM

## 2018-12-26 DIAGNOSIS — M7918 Myalgia, other site: Secondary | ICD-10-CM | POA: Diagnosis not present

## 2018-12-26 LAB — SARS CORONAVIRUS 2 (TAT 6-24 HRS): SARS Coronavirus 2: NEGATIVE

## 2018-12-26 MED ORDER — PREDNISONE 20 MG PO TABS: ORAL_TABLET | ORAL | 0 refills | Status: DC

## 2018-12-26 MED ORDER — PREDNISONE 20 MG PO TABS
60.0000 mg | ORAL_TABLET | Freq: Once | ORAL | Status: AC
  Administered 2018-12-26: 60 mg via ORAL
  Filled 2018-12-26: qty 3

## 2018-12-26 MED ORDER — SODIUM CHLORIDE 0.9 % IV BOLUS
1000.0000 mL | Freq: Once | INTRAVENOUS | Status: AC
  Administered 2018-12-26: 1000 mL via INTRAVENOUS

## 2018-12-26 MED ORDER — BENZONATATE 100 MG PO CAPS
100.0000 mg | ORAL_CAPSULE | Freq: Once | ORAL | Status: AC
  Administered 2018-12-26: 100 mg via ORAL
  Filled 2018-12-26: qty 1

## 2018-12-26 MED ORDER — ALBUTEROL SULFATE HFA 108 (90 BASE) MCG/ACT IN AERS
6.0000 | INHALATION_SPRAY | RESPIRATORY_TRACT | Status: DC | PRN
  Administered 2018-12-26: 6 via RESPIRATORY_TRACT
  Filled 2018-12-26: qty 6.7

## 2018-12-26 MED ORDER — AEROCHAMBER PLUS FLO-VU LARGE MISC
Status: AC
  Administered 2018-12-26: 15:00:00
  Filled 2018-12-26: qty 1

## 2018-12-26 MED ORDER — BENZONATATE 100 MG PO CAPS: 100.0000 mg | ORAL_CAPSULE | Freq: Three times a day (TID) | ORAL | 0 refills | Status: DC

## 2018-12-26 NOTE — ED Provider Notes (Signed)
[facility] EMERGENCY DEPARTMENT Provider Note   CSN: [id number] Arrival date & time: 12/26/18  1241     History   Chief Complaint Chief Complaint  Patient presents with  . Shortness of Breath  . Cough    HPI Kaycee Haycraft is a 33 y.o. female.     The history is provided by the patient. No language interpreter was used.  Shortness of Breath Associated symptoms: cough   Cough Associated symptoms: shortness of breath      33 year old female with history of chronic asthma presenting with cold symptoms.  Patient report for the past 3 days she has had nonproductive cough, body aches, shortness of breath, and increased wheezing.  Symptom is persistent, shortness of breath has increased prompting this ER visit.  She does not complain of any significant fever, loss of taste or smell, nausea vomiting diarrhea, or dysuria.  She does report recent sick contact with a friend that has COVID-19.  She also needs to report lack of access to her albuterol inhaler.  She is a smoker.  Past Medical History:  Diagnosis Date  . Abscess   . Asthma     Patient Active Problem List   Diagnosis Date Noted  . Bacteremia   . Facial cellulitis 11/13/2018  . Chronic asthma   . Polysubstance abuse [facility])     Past Surgical History:  Procedure Laterality Date  . INCISION AND DRAINAGE ABSCESS N/A 11/16/2018   Procedure: INCISION AND DRAINAGE ABSCESS cheek;  Surgeon: Ocie Doyne, DDS;  Location: [facility] ORS;  Service: Oral Surgery;  Laterality: N/A;  . TEE WITHOUT CARDIOVERSION N/A 11/19/2018   Procedure: TRANSESOPHAGEAL ECHOCARDIOGRAM (TEE);  Surgeon: Little Ishikawa, MD;  Location: [facility] ENDOSCOPY;  Service: Endoscopy;  Laterality: N/A;  . TOOTH EXTRACTION N/A 11/16/2018   Procedure: DENTAL /EXTRACTIONS tooth 4 and 19;  Surgeon: Ocie Doyne, DDS;  Location: [facility] ORS;  Service: Oral Surgery;  Laterality: N/A;     OB History   No obstetric history on file.      Home  Medications    Prior to Admission medications   Not on File    Family History History reviewed. No pertinent family history.  Social History Social History   Tobacco Use  . Smoking status: Current Every Day Smoker  . Smokeless tobacco: Current User  Substance Use Topics  . Alcohol use: Not on file  . Drug use: Not on file     Allergies   Patient has no known allergies.   Review of Systems Review of Systems  Respiratory: Positive for cough and shortness of breath.   All other systems reviewed and are negative.    Physical Exam Updated Vital Signs BP 124/85   Pulse (!) 125   Temp 98.7 F (37.1 C) (Oral)   Resp (!) 24   SpO2 95%   Physical Exam Vitals signs and nursing note reviewed.  Constitutional:      General: She is not in acute distress.    Appearance: She is well-developed.  HENT:     Head: Atraumatic.  Eyes:     Conjunctiva/sclera: Conjunctivae normal.  Neck:     Musculoskeletal: Neck supple.  Cardiovascular:     Rate and Rhythm: Tachycardia present.  Pulmonary:     Effort: Tachypnea and accessory muscle usage present.     Breath sounds: Wheezing present. No rhonchi or rales.  Musculoskeletal:     Right lower leg: No edema.     Left lower leg: No  edema.  Skin:    Capillary Refill: Capillary refill takes less than 2 seconds.     Findings: No rash.  Neurological:     Mental Status: She is alert and oriented to person, place, and time.  Psychiatric:        Mood and Affect: Mood normal.      ED Treatments / Results  Labs (all labs ordered are listed, but only abnormal results are displayed) Labs Reviewed  SARS CORONAVIRUS 2 (TAT 6-24 HRS)    EKG None  Radiology Dg Chest Port 1 View  Result Date: 12/26/2018 CLINICAL DATA:  Cough and shortness of breath.  Chest pain. EXAM: PORTABLE CHEST 1 VIEW COMPARISON:  None. FINDINGS: Lungs are clear. Heart size and pulmonary vascularity are normal. No adenopathy. No pneumothorax. No bone  lesions. IMPRESSION: No edema or consolidation. Electronically Signed   By: Lowella Grip III M.D.   On: 12/26/2018 13:49    Procedures Procedures (including critical care time)  Medications Ordered in ED Medications  albuterol (VENTOLIN HFA) 108 (90 Base) MCG/ACT inhaler 6 puff (6 puffs Inhalation Given 12/26/18 1445)  sodium chloride 0.9 % bolus 1,000 mL (1,000 mLs Intravenous New Bag/Given 12/26/18 1457)  predniSONE (DELTASONE) tablet 60 mg (60 mg Oral Given 12/26/18 1445)  benzonatate (TESSALON) capsule 100 mg (100 mg Oral Given 12/26/18 1445)  AeroChamber Plus Flo-Vu Large MISC (  Given 12/26/18 1445)     Initial Impression / Assessment and Plan / ED Course  I have reviewed the triage vital signs and the nursing notes.  Pertinent labs & imaging results that were available during my care of the patient were reviewed by me and considered in my medical decision making (see chart for details).        BP (!) 138/92 (BP Location: Right Arm)   Pulse (!) 119   Temp 98.7 F (37.1 C) (Oral)   Resp (!) 24   SpO2 93%    Final Clinical Impressions(s) / ED Diagnoses   Final diagnoses:  Cough  SOB (shortness of breath)    ED Discharge Orders    None     2:01 PM Patient with history of chronic asthma here with shortness of breath, nonproductive cough, and body aches as well as recent sick contact with someone that has COVID-19.  Patient does appears to be in respiratory discomfort, using accessory muscle, actively wheezing.  She is not hypoxic but is tachypneic and tachycardic.  Initial chest x-ray without any concerning finding.  Will provide albuterol inhaler, steroid, cough medication, and obtain COVID-19 testing.  Syniyah Bourne was evaluated in Emergency Department on 12/26/2018 for the symptoms described in the history of present illness. She was evaluated in the context of the global COVID-19 pandemic, which necessitated consideration that the patient might be at risk for  infection with the SARS-CoV-2 virus that causes COVID-19. Institutional protocols and algorithms that pertain to the evaluation of patients at risk for COVID-19 are in a state of rapid change based on information released by regulatory bodies including the CDC and federal and state organizations. These policies and algorithms were followed during the patient's care in the ED.  3:15 PM After receiving breathing treatment, patient felt better.  Although she is mildly tachycardic, she feels comfortable enough to go home.  She also received IV fluid here.  Encourage patient to self quarantine await COVID-19 results.  Return precaution discussed.   Domenic Moras, PA-C 12/26/18 1518    Gareth Morgan, MD 12/27/18 2233

## 2018-12-26 NOTE — ED Notes (Signed)
Patient verbalizes understanding of discharge instructions. Opportunity for questioning and answers were provided. Armband removed by staff, pt discharged from ED ambulatory.   

## 2018-12-26 NOTE — ED Triage Notes (Signed)
Pt reports she has had a cough for a few days now and she started having shortness of breath last night.

## 2019-01-13 ENCOUNTER — Emergency Department (HOSPITAL_BASED_OUTPATIENT_CLINIC_OR_DEPARTMENT_OTHER)
Admission: EM | Admit: 2019-01-13 | Discharge: 2019-01-13 | Disposition: A | Discharge status: Home / Self Care | Payer: Medicaid - Out of State | Attending: Emergency Medicine | Admitting: Emergency Medicine

## 2019-01-13 ENCOUNTER — Emergency Department (HOSPITAL_BASED_OUTPATIENT_CLINIC_OR_DEPARTMENT_OTHER): Payer: Medicaid - Out of State

## 2019-01-13 ENCOUNTER — Encounter (HOSPITAL_BASED_OUTPATIENT_CLINIC_OR_DEPARTMENT_OTHER): Payer: Self-pay

## 2019-01-13 ENCOUNTER — Other Ambulatory Visit: Payer: Self-pay

## 2019-01-13 DIAGNOSIS — Z20828 Contact with and (suspected) exposure to other viral communicable diseases: Secondary | ICD-10-CM | POA: Diagnosis not present

## 2019-01-13 DIAGNOSIS — J4541 Moderate persistent asthma with (acute) exacerbation: Secondary | ICD-10-CM | POA: Insufficient documentation

## 2019-01-13 DIAGNOSIS — R0602 Shortness of breath: Secondary | ICD-10-CM | POA: Diagnosis present

## 2019-01-13 DIAGNOSIS — F17210 Nicotine dependence, cigarettes, uncomplicated: Secondary | ICD-10-CM | POA: Insufficient documentation

## 2019-01-13 HISTORY — DX: Other psychoactive substance abuse, uncomplicated: F19.10

## 2019-01-13 LAB — CBC WITH DIFFERENTIAL/PLATELET
Abs Immature Granulocytes: 0.04 10*3/uL (ref 0.00–0.07)
Basophils Absolute: 0.1 10*3/uL (ref 0.0–0.1)
Basophils Relative: 1 %
Eosinophils Absolute: 2.5 10*3/uL — ABNORMAL HIGH (ref 0.0–0.5)
Eosinophils Relative: 15 %
HCT: 49.1 % — ABNORMAL HIGH (ref 36.0–46.0)
Hemoglobin: 15.9 g/dL — ABNORMAL HIGH (ref 12.0–15.0)
Immature Granulocytes: 0 %
Lymphocytes Relative: 23 %
Lymphs Abs: 3.9 10*3/uL (ref 0.7–4.0)
MCH: 29.2 pg (ref 26.0–34.0)
MCHC: 32.4 g/dL (ref 30.0–36.0)
MCV: 90.3 fL (ref 80.0–100.0)
Monocytes Absolute: 1 10*3/uL (ref 0.1–1.0)
Monocytes Relative: 6 %
Neutro Abs: 9.1 10*3/uL — ABNORMAL HIGH (ref 1.7–7.7)
Neutrophils Relative %: 55 %
Platelets: 303 10*3/uL (ref 150–400)
RBC: 5.44 MIL/uL — ABNORMAL HIGH (ref 3.87–5.11)
RDW: 13 % (ref 11.5–15.5)
Smear Review: NORMAL
WBC: 16.6 10*3/uL — ABNORMAL HIGH (ref 4.0–10.5)
nRBC: 0 % (ref 0.0–0.2)

## 2019-01-13 LAB — BASIC METABOLIC PANEL
Anion gap: 10 (ref 5–15)
BUN: 7 mg/dL (ref 6–20)
CO2: 23 mmol/L (ref 22–32)
Calcium: 9.6 mg/dL (ref 8.9–10.3)
Chloride: 102 mmol/L (ref 98–111)
Creatinine, Ser: 0.66 mg/dL (ref 0.44–1.00)
GFR calc Af Amer: >60 mL/min (ref >60)
GFR calc non Af Amer: >60 mL/min (ref >60)
Glucose, Bld: 195 mg/dL — ABNORMAL HIGH (ref 70–99)
Potassium: 3.8 mmol/L (ref 3.5–5.1)
Sodium: 135 mmol/L (ref 135–145)

## 2019-01-13 LAB — SARS CORONAVIRUS 2 AG (30 MIN TAT): SARS Coronavirus 2 Ag: NEGATIVE

## 2019-01-13 MED ORDER — ALBUTEROL SULFATE HFA 108 (90 BASE) MCG/ACT IN AERS
8.0000 | INHALATION_SPRAY | Freq: Once | RESPIRATORY_TRACT | Status: AC
  Administered 2019-01-13: 14:00:00 8 via RESPIRATORY_TRACT

## 2019-01-13 MED ORDER — IPRATROPIUM BROMIDE HFA 17 MCG/ACT IN AERS
INHALATION_SPRAY | RESPIRATORY_TRACT | Status: AC
  Filled 2019-01-13: qty 12.9

## 2019-01-13 MED ORDER — METHYLPREDNISOLONE SODIUM SUCC 125 MG IJ SOLR
125.0000 mg | Freq: Once | INTRAMUSCULAR | Status: AC
  Administered 2019-01-13: 125 mg via INTRAVENOUS
  Filled 2019-01-13: qty 2

## 2019-01-13 MED ORDER — ALBUTEROL SULFATE HFA 108 (90 BASE) MCG/ACT IN AERS
INHALATION_SPRAY | RESPIRATORY_TRACT | Status: AC
  Filled 2019-01-13: qty 6.7

## 2019-01-13 MED ORDER — MAGNESIUM SULFATE 2 GM/50ML IV SOLN
2.0000 g | Freq: Once | INTRAVENOUS | Status: AC
  Administered 2019-01-13: 14:00:00 2 g via INTRAVENOUS
  Filled 2019-01-13: qty 50

## 2019-01-13 MED ORDER — IPRATROPIUM BROMIDE HFA 17 MCG/ACT IN AERS
2.0000 | INHALATION_SPRAY | Freq: Once | RESPIRATORY_TRACT | Status: AC
  Administered 2019-01-13: 14:00:00 2 via RESPIRATORY_TRACT

## 2019-01-13 MED ORDER — PREDNISONE 20 MG PO TABS: ORAL_TABLET | ORAL | 0 refills | Status: DC

## 2019-01-13 MED FILL — predniSONE 20 MG TABS: 20 | 4 days supply | Qty: 8 | Fill #0

## 2019-01-13 NOTE — ED Triage Notes (Addendum)
Pt c/o SOB, wheezing x 2 days-cough/fever x 4 days-resp distress noted-taken to tx area with RT at [facility]-pt states she had neg covid ~3weeks ago-test was r/t to being symptomatic

## 2019-01-13 NOTE — ED Notes (Signed)
Pt ambulated with O2 levels remaining at 91% on RA. ED MD Made aware, agrees pt can be discharged home. Pt denies pain, NAD.

## 2019-01-13 NOTE — ED Notes (Addendum)
SpO2 89-90% heart rate 114-120, Pt ambulated without any assistance. Pt states some SOB. Pt not on oxygen in room.

## 2019-01-13 NOTE — Discharge Instructions (Signed)
Use your inhaler every 4 hours(6 puffs) while awake, return for sudden worsening shortness of breath, or if you need to use your inhaler more often.  ° °

## 2019-01-13 NOTE — ED Provider Notes (Signed)
[facility] EMERGENCY DEPARTMENT Provider Note   CSN: [id number] Arrival date & time: 01/13/19  1312     History   Chief Complaint Chief Complaint  Patient presents with  . Shortness of Breath    HPI Amanda Ray is a 33 y.o. female.     33 yo F with a cc of sob.  Going on for the past week.  Patient with some cough, no fevers.  No known sick contacts.  Feels like her prior asthma exacerbations.  Using her inhaler at home without improvement.  Significantly worsening last night.  Really she does have trouble catching her breath.  No chest pain or pressure.  The history is provided by the patient.  Shortness of Breath Severity:  Moderate Onset quality:  Gradual Duration:  1 week Timing:  Constant Progression:  Worsening Chronicity:  Recurrent Relieved by:  Nothing Worsened by:  Nothing Ineffective treatments:  None tried Associated symptoms: cough   Associated symptoms: no chest pain, no fever, no headaches, no vomiting and no wheezing     Past Medical History:  Diagnosis Date  . Abscess   . Asthma   . IV drug abuse (HCC)   . Polysubstance abuse [facility])     Patient Active Problem List   Diagnosis Date Noted  . Bacteremia   . Facial cellulitis 11/13/2018  . Chronic asthma   . Polysubstance abuse [facility])     Past Surgical History:  Procedure Laterality Date  . INCISION AND DRAINAGE ABSCESS N/A 11/16/2018   Procedure: INCISION AND DRAINAGE ABSCESS cheek;  Surgeon: Ocie Doyne, DDS;  Location: [facility] ORS;  Service: Oral Surgery;  Laterality: N/A;  . TEE WITHOUT CARDIOVERSION N/A 11/19/2018   Procedure: TRANSESOPHAGEAL ECHOCARDIOGRAM (TEE);  Surgeon: Little Ishikawa, MD;  Location: [facility] ENDOSCOPY;  Service: Endoscopy;  Laterality: N/A;  . TOOTH EXTRACTION N/A 11/16/2018   Procedure: DENTAL /EXTRACTIONS tooth 4 and 19;  Surgeon: Ocie Doyne, DDS;  Location: [facility] ORS;  Service: Oral Surgery;  Laterality: N/A;     OB History   No obstetric history on  file.      Home Medications    Prior to Admission medications   Medication Sig Start Date End Date Taking? Authorizing Provider  benzonatate (TESSALON) 100 MG capsule Take 1 capsule (100 mg total) by mouth every 8 (eight) hours. 12/26/18   Fayrene Helper, PA-C  predniSONE (DELTASONE) 20 MG tablet 2 tabs po daily x 4 days 01/13/19   Melene Plan, DO    Family History No family history on file.  Social History Social History   Tobacco Use  . Smoking status: Current Every Day Smoker    Types: Cigarettes  . Smokeless tobacco: Current User  Substance Use Topics  . Alcohol use: Never    Frequency: Never  . Drug use: Yes    Types: IV    Comment: narcotic abuse     Allergies   Patient has no known allergies.   Review of Systems Review of Systems  Constitutional: Positive for chills. Negative for fever.  HENT: Negative for congestion and rhinorrhea.   Eyes: Negative for redness and visual disturbance.  Respiratory: Positive for cough and shortness of breath. Negative for wheezing.   Cardiovascular: Negative for chest pain and palpitations.  Gastrointestinal: Negative for nausea and vomiting.  Genitourinary: Negative for dysuria and urgency.  Musculoskeletal: Negative for arthralgias and myalgias.  Skin: Negative for pallor and wound.  Neurological: Negative for dizziness and headaches.     Physical Exam  Updated Vital Signs BP 135/85 (BP Location: Right Arm)   Pulse (!) 110   Temp 98.6 F (37 C) (Oral)   Resp 20   Ht 5\' 4"  (1.626 m)   Wt 47.6 kg   SpO2 93%   BMI 18.02 kg/m   Physical Exam Vitals signs and nursing note reviewed.  Constitutional:      General: She is not in acute distress.    Appearance: She is well-developed. She is not diaphoretic.  HENT:     Head: Normocephalic and atraumatic.  Eyes:     Pupils: Pupils are equal, round, and reactive to light.  Neck:     Musculoskeletal: Normal range of motion and neck supple.  Cardiovascular:     Rate and  Rhythm: Normal rate and regular rhythm.     Heart sounds: No murmur. No friction rub. No gallop.   Pulmonary:     Effort: Pulmonary effort is normal.     Breath sounds: Wheezing (diffuse in all fields) present. No rales.  Abdominal:     General: There is no distension.     Palpations: Abdomen is soft.     Tenderness: There is no abdominal tenderness.  Musculoskeletal:        General: No tenderness.  Skin:    General: Skin is warm and dry.  Neurological:     Mental Status: She is alert and oriented to person, place, and time.  Psychiatric:        Behavior: Behavior normal.      ED Treatments / Results  Labs (all labs ordered are listed, but only abnormal results are displayed) Labs Reviewed  CBC WITH DIFFERENTIAL/PLATELET - Abnormal; Notable for the following components:      Result Value   WBC 16.6 (*)    RBC 5.44 (*)    Hemoglobin 15.9 (*)    HCT 49.1 (*)    Neutro Abs 9.1 (*)    Eosinophils Absolute 2.5 (*)    All other components within normal limits  BASIC METABOLIC PANEL - Abnormal; Notable for the following components:   Glucose, Bld 195 (*)    All other components within normal limits  SARS CORONAVIRUS 2 AG (30 MIN TAT)  PATHOLOGIST SMEAR REVIEW    EKG None  Radiology Dg Chest Port 1 View  Result Date: 01/13/2019 CLINICAL DATA:  Shortness of breath EXAM: PORTABLE CHEST 1 VIEW COMPARISON:  12/26/2018 FINDINGS: The heart size and mediastinal contours are within normal limits. Both lungs are clear. The visualized skeletal structures are unremarkable. IMPRESSION: No acute abnormality of the lungs in AP portable projection. Electronically Signed   By: Lauralyn PrimesAlex  Bibbey M.D.   On: 01/13/2019 13:55    Procedures Procedures (including critical care time)  Medications Ordered in ED Medications  albuterol (VENTOLIN HFA) 108 (90 Base) MCG/ACT inhaler 8 puff (8 puffs Inhalation Given 01/13/19 1332)  ipratropium (ATROVENT HFA) inhaler 2 puff (2 puffs Inhalation Given  01/13/19 1332)  magnesium sulfate IVPB 2 g 50 mL (0 g Intravenous Stopped 01/13/19 1356)  methylPREDNISolone sodium succinate (SOLU-MEDROL) 125 mg/2 mL injection 125 mg (125 mg Intravenous Given 01/13/19 1338)     Initial Impression / Assessment and Plan / ED Course  I have reviewed the triage vital signs and the nursing notes.  Pertinent labs & imaging results that were available during my care of the patient were reviewed by me and considered in my medical decision making (see chart for details).        33 yo  F with a cc of cough and shortness of breath.  Going on for the past week.  Feels like her prior asthma.  Significant wheezing on my exam the is good aeration.  Patient however is hypoxic into the mid to upper 80s.  He is tachypneic as well.  Given 8 puffs of albuterol and 2 ipratropium.  Given Solu-Medrol and magnesium.  With hypoxia anticipate she will likely need admission will obtain chest x-ray rapid Covid test lab work reassess.  Rapid covid negative, patient much improved post breathing treatments.  Will attempt to ambulate off O2.   Ambulated without needing O2.  D/c home.   3:21 PM:  I have discussed the diagnosis/risks/treatment options with the patient and believe the pt to be eligible for discharge home to follow-up with PCP. We also discussed returning to the ED immediately if new or worsening sx occur. We discussed the sx which are most concerning (e.g., sudden worsening pain, fever, inability to tolerate by mouth) that necessitate immediate return. Medications administered to the patient during their visit and any new prescriptions provided to the patient are listed below.  Medications given during this visit Medications  albuterol (VENTOLIN HFA) 108 (90 Base) MCG/ACT inhaler 8 puff (8 puffs Inhalation Given 01/13/19 1332)  ipratropium (ATROVENT HFA) inhaler 2 puff (2 puffs Inhalation Given 01/13/19 1332)  magnesium sulfate IVPB 2 g 50 mL (0 g Intravenous Stopped 01/13/19  1356)  methylPREDNISolone sodium succinate (SOLU-MEDROL) 125 mg/2 mL injection 125 mg (125 mg Intravenous Given 01/13/19 1338)     The patient appears reasonably screen and/or stabilized for discharge and I doubt any other medical condition or other [facility] requiring further screening, evaluation, or treatment in the ED at this time prior to discharge.    Final Clinical Impressions(s) / ED Diagnoses   Final diagnoses:  Moderate persistent asthma with exacerbation    ED Discharge Orders         Ordered    predniSONE (DELTASONE) 20 MG tablet     01/13/19 Pecos, Ayven Pheasant, DO 01/13/19 1521

## 2019-01-15 LAB — PATHOLOGIST SMEAR REVIEW: Path Review: ELEVATED

## 2019-03-10 ENCOUNTER — Emergency Department (HOSPITAL_COMMUNITY): Payer: Medicaid - Out of State

## 2019-03-10 ENCOUNTER — Emergency Department (HOSPITAL_COMMUNITY)
Admission: EM | Admit: 2019-03-10 | Discharge: 2019-03-11 | Disposition: A | Discharge status: Home / Self Care | Payer: Medicaid - Out of State | Attending: Emergency Medicine | Admitting: Emergency Medicine

## 2019-03-10 ENCOUNTER — Other Ambulatory Visit: Payer: Self-pay

## 2019-03-10 ENCOUNTER — Encounter (HOSPITAL_COMMUNITY): Payer: Self-pay

## 2019-03-10 DIAGNOSIS — J4541 Moderate persistent asthma with (acute) exacerbation: Secondary | ICD-10-CM | POA: Insufficient documentation

## 2019-03-10 DIAGNOSIS — F17210 Nicotine dependence, cigarettes, uncomplicated: Secondary | ICD-10-CM | POA: Diagnosis not present

## 2019-03-10 DIAGNOSIS — Z20822 Contact with and (suspected) exposure to covid-19: Secondary | ICD-10-CM | POA: Diagnosis not present

## 2019-03-10 DIAGNOSIS — R0603 Acute respiratory distress: Secondary | ICD-10-CM | POA: Diagnosis present

## 2019-03-10 LAB — BASIC METABOLIC PANEL
Anion gap: 10 (ref 5–15)
BUN: 9 mg/dL (ref 6–20)
CO2: 28 mmol/L (ref 22–32)
Calcium: 9.8 mg/dL (ref 8.9–10.3)
Chloride: 100 mmol/L (ref 98–111)
Creatinine, Ser: 0.48 mg/dL (ref 0.44–1.00)
GFR calc Af Amer: >60 mL/min (ref >60)
GFR calc non Af Amer: >60 mL/min (ref >60)
Glucose, Bld: 157 mg/dL — ABNORMAL HIGH (ref 70–99)
Potassium: 4.1 mmol/L (ref 3.5–5.1)
Sodium: 138 mmol/L (ref 135–145)

## 2019-03-10 LAB — CBC WITH DIFFERENTIAL/PLATELET
Abs Immature Granulocytes: 0.05 10*3/uL (ref 0.00–0.07)
Basophils Absolute: 0.1 10*3/uL (ref 0.0–0.1)
Basophils Relative: 1 %
Eosinophils Absolute: 1.7 10*3/uL — ABNORMAL HIGH (ref 0.0–0.5)
Eosinophils Relative: 11 %
HCT: 50.3 % — ABNORMAL HIGH (ref 36.0–46.0)
Hemoglobin: 16.7 g/dL — ABNORMAL HIGH (ref 12.0–15.0)
Immature Granulocytes: 0 %
Lymphocytes Relative: 14 %
Lymphs Abs: 2.2 10*3/uL (ref 0.7–4.0)
MCH: 29.5 pg (ref 26.0–34.0)
MCHC: 33.2 g/dL (ref 30.0–36.0)
MCV: 88.9 fL (ref 80.0–100.0)
Monocytes Absolute: 1.4 10*3/uL — ABNORMAL HIGH (ref 0.1–1.0)
Monocytes Relative: 9 %
Neutro Abs: 10.6 10*3/uL — ABNORMAL HIGH (ref 1.7–7.7)
Neutrophils Relative %: 65 %
Platelets: 421 10*3/uL — ABNORMAL HIGH (ref 150–400)
RBC: 5.66 MIL/uL — ABNORMAL HIGH (ref 3.87–5.11)
RDW: 13.4 % (ref 11.5–15.5)
WBC: 16.1 10*3/uL — ABNORMAL HIGH (ref 4.0–10.5)
nRBC: 0 % (ref 0.0–0.2)

## 2019-03-10 LAB — D-DIMER, QUANTITATIVE: D-Dimer, Quant: 0.75 ug/mL-FEU — ABNORMAL HIGH (ref 0.00–0.50)

## 2019-03-10 LAB — I-STAT BETA HCG BLOOD, ED (MC, WL, AP ONLY): I-stat hCG, quantitative: <5 m[IU]/mL (ref <5)

## 2019-03-10 LAB — POC SARS CORONAVIRUS 2 AG -  ED: SARS Coronavirus 2 Ag: NEGATIVE

## 2019-03-10 LAB — RESPIRATORY PANEL BY RT PCR (FLU A&B, COVID)
Influenza A by PCR: NEGATIVE
Influenza B by PCR: NEGATIVE
SARS Coronavirus 2 by RT PCR: NEGATIVE

## 2019-03-10 MED ORDER — ALBUTEROL SULFATE HFA 108 (90 BASE) MCG/ACT IN AERS
8.0000 | INHALATION_SPRAY | Freq: Once | RESPIRATORY_TRACT | Status: AC
  Administered 2019-03-10: 8 via RESPIRATORY_TRACT
  Filled 2019-03-10: qty 6.7

## 2019-03-10 MED ORDER — MAGNESIUM SULFATE 2 GM/50ML IV SOLN
2.0000 g | Freq: Once | INTRAVENOUS | Status: AC
  Administered 2019-03-10: 2 g via INTRAVENOUS
  Filled 2019-03-10: qty 50

## 2019-03-10 MED ORDER — SODIUM CHLORIDE 0.9 % IV BOLUS
1000.0000 mL | Freq: Once | INTRAVENOUS | Status: AC
  Administered 2019-03-10: 1000 mL via INTRAVENOUS

## 2019-03-10 MED ORDER — IPRATROPIUM-ALBUTEROL 0.5-2.5 (3) MG/3ML IN SOLN
3.0000 mL | Freq: Once | RESPIRATORY_TRACT | Status: AC
  Administered 2019-03-10: 3 mL via RESPIRATORY_TRACT
  Filled 2019-03-10: qty 3

## 2019-03-10 MED ORDER — METHYLPREDNISOLONE SODIUM SUCC 125 MG IJ SOLR
125.0000 mg | Freq: Once | INTRAMUSCULAR | Status: AC
  Administered 2019-03-10: 125 mg via INTRAVENOUS
  Filled 2019-03-10: qty 2

## 2019-03-10 NOTE — ED Notes (Addendum)
Pt reports using heroin at 1430 today.

## 2019-03-10 NOTE — ED Provider Notes (Signed)
[facility]-EMERGENCY DEPT Provider Note   CSN: [id number] Arrival date & time: 03/10/19  1948     History No chief complaint on file.   Amanda Ray is a 34 y.o. female.  Level 5 caveat for respiratory distress.  Patient with history of asthma here with difficulty breathing for the past 2 days.  States Amanda Ray been out of Amanda Ray albuterol for the past 2 days.  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  Amanda Ray complains of chest tightness, shortness of breath and nonproductive cough.  No fever.  Denies any possibility of pregnancy.  No abdominal pain, nausea, vomiting.  States Amanda Ray has been hospitalized for Amanda Ray asthma as a child. No known Covid exposures.  The history is provided by the patient.       Past Medical History:  Diagnosis Date  . Abscess   . Asthma   . IV drug abuse (HCC)   . Polysubstance abuse [facility])     Patient Active Problem List   Diagnosis Date Noted  . Bacteremia   . Facial cellulitis 11/13/2018  . Chronic asthma   . Polysubstance abuse [facility])     Past Surgical History:  Procedure Laterality Date  . INCISION AND DRAINAGE ABSCESS N/A 11/16/2018   Procedure: INCISION AND DRAINAGE ABSCESS cheek;  Surgeon: Ocie Doyne, DDS;  Location: [facility] ORS;  Service: Oral Surgery;  Laterality: N/A;  . TEE WITHOUT CARDIOVERSION N/A 11/19/2018   Procedure: TRANSESOPHAGEAL ECHOCARDIOGRAM (TEE);  Surgeon: Little Ishikawa, MD;  Location: [facility] ENDOSCOPY;  Service: Endoscopy;  Laterality: N/A;  . TOOTH EXTRACTION N/A 11/16/2018   Procedure: DENTAL /EXTRACTIONS tooth 4 and 19;  Surgeon: Ocie Doyne, DDS;  Location: [facility] ORS;  Service: Oral Surgery;  Laterality: N/A;     OB History   No obstetric history on file.     No family history on file.  Social History   Tobacco Use  . Smoking status: Current Every Day Smoker    Types: Cigarettes  . Smokeless tobacco: Current User  Substance Use Topics  . Alcohol use: Never  . Drug use: Yes    Types: IV      Comment: narcotic abuse    Home Medications Prior to Admission medications   Medication Sig Start Date End Date Taking? Authorizing Provider  benzonatate (TESSALON) 100 MG capsule Take 1 capsule (100 mg total) by mouth every 8 (eight) hours. 12/26/18   Fayrene Helper, PA-C  predniSONE (DELTASONE) 20 MG tablet 2 tabs po daily x 4 days 01/13/19   Melene Plan, DO    Allergies    Patient has no known allergies.  Review of Systems   Review of Systems  Constitutional: Negative for activity change, appetite change and fever.  HENT: Negative for congestion and rhinorrhea.   Respiratory: Positive for chest tightness and shortness of breath.   Cardiovascular: Positive for chest pain.  Gastrointestinal: Negative for abdominal pain, nausea and vomiting.  Genitourinary: Negative for dysuria. Genital sores: .ro.  Musculoskeletal: Negative for arthralgias and myalgias.  Neurological: Negative for dizziness, weakness and headaches.   all other systems are negative except as noted in the HPI and PMH.    Physical Exam Updated Vital Signs BP (!) 134/95 (BP Location: Left Arm)   Pulse (!) 134   Temp 99.7 F (37.6 C) (Oral)   Resp (!) 34   Ht 5\' 5"  (1.651 m)   Wt 49.9 kg   SpO2 93%   BMI 18.30 kg/m   Physical Exam Vitals and  nursing note reviewed.  Constitutional:      General: Amanda Ray is in acute distress.     Appearance: Amanda Ray is well-developed.     Comments: Moderate respiratory distress, tachypneic, speaking short phrases  HENT:     Head: Normocephalic and atraumatic.     Mouth/Throat:     Pharynx: No oropharyngeal exudate.  Eyes:     Conjunctiva/sclera: Conjunctivae normal.     Pupils: Pupils are equal, round, and reactive to light.  Neck:     Comments: No meningismus. Cardiovascular:     Rate and Rhythm: Regular rhythm. Tachycardia present.     Heart sounds: Normal heart sounds. No murmur.  Pulmonary:     Effort: Respiratory distress present.     Breath sounds: Wheezing present.      Comments: Diffuse inspiratory expiratory wheezing throughout Chest:     Chest wall: No tenderness.  Abdominal:     Palpations: Abdomen is soft.     Tenderness: There is no abdominal tenderness. There is no guarding or rebound.  Musculoskeletal:        General: No tenderness. Normal range of motion.     Cervical back: Normal range of motion and neck supple.  Skin:    General: Skin is warm.  Neurological:     Mental Status: Amanda Ray is alert and oriented to person, place, and time.     Cranial Nerves: No cranial nerve deficit.     Motor: No abnormal muscle tone.     Coordination: Coordination normal.     Comments: No ataxia on finger to nose bilaterally. No pronator drift. 5/5 strength throughout. CN 2-12 intact.Equal grip strength. Sensation intact.   Psychiatric:        Behavior: Behavior normal.     ED Results / Procedures / Treatments   Labs (all labs ordered are listed, but only abnormal results are displayed) Labs Reviewed  CBC WITH DIFFERENTIAL/PLATELET - Abnormal; Notable for the following components:      Result Value   WBC 16.1 (*)    RBC 5.66 (*)    Hemoglobin 16.7 (*)    HCT 50.3 (*)    Platelets 421 (*)    Neutro Abs 10.6 (*)    Monocytes Absolute 1.4 (*)    Eosinophils Absolute 1.7 (*)    All other components within normal limits  BASIC METABOLIC PANEL - Abnormal; Notable for the following components:   Glucose, Bld 157 (*)    All other components within normal limits  D-DIMER, QUANTITATIVE (NOT AT [facility]) - Abnormal; Notable for the following components:   D-Dimer, Quant 0.75 (*)    All other components within normal limits  RESPIRATORY PANEL BY RT PCR (FLU A&B, COVID)  I-STAT BETA HCG BLOOD, ED ([facility], [facility], [facility] ONLY)  POC SARS CORONAVIRUS 2 AG -  ED    EKG EKG Interpretation  Date/Time:  Tuesday March 10 2019 20:16:38 EST Ventricular Rate:  133 PR Interval:    QRS Duration: 84 QT Interval:  297 QTC Calculation: 442 R Axis:   113 Text  Interpretation: Sinus tachycardia Prolonged PR interval Consider right atrial enlargement Right axis deviation No significant change was found Confirmed by Glynn Octave [phone]) on 03/10/2019 11:24:04 PM   Radiology DG Chest Portable 1 View  Result Date: 03/10/2019 CLINICAL DATA:  Shortness of breath EXAM: PORTABLE CHEST 1 VIEW COMPARISON:  01/13/2019 FINDINGS: The heart size and mediastinal contours are within normal limits. Both lungs are clear. The visualized skeletal structures are unremarkable. IMPRESSION: No active  disease. Electronically Signed   By: Ulyses Jarred M.D.   On: 03/10/2019 20:55    Procedures .Critical Care Performed by: Ezequiel Essex, MD Authorized by: Ezequiel Essex, MD   Critical care provider statement:    Critical care time (minutes):  35   Critical care was necessary to treat or prevent imminent or life-threatening deterioration of the following conditions:  Respiratory failure   Critical care was time spent personally by me on the following activities:  Discussions with consultants, evaluation of patient's response to treatment, examination of patient, ordering and performing treatments and interventions, ordering and review of laboratory studies, ordering and review of radiographic studies, pulse oximetry, re-evaluation of patient's condition, obtaining history from patient or surrogate and review of old charts   (including critical care time)  Medications Ordered in ED Medications  sodium chloride 0.9 % bolus 1,000 mL (has no administration in time range)  methylPREDNISolone sodium succinate (SOLU-MEDROL) 125 mg/2 mL injection 125 mg (has no administration in time range)  ipratropium-albuterol (DUONEB) 0.5-2.5 (3) MG/3ML nebulizer solution 3 mL (has no administration in time range)  albuterol (VENTOLIN HFA) 108 (90 Base) MCG/ACT inhaler 8 puff (has no administration in time range)  magnesium sulfate IVPB 2 g 50 mL (has no administration in time range)     ED Course  I have reviewed the triage vital signs and the nursing notes.  Pertinent labs & imaging results that were available during my care of the patient were reviewed by me and considered in my medical decision making (see chart for details).    MDM Rules/Calculators/A&P                      Asthmatic here with difficulty breathing, Amanda Ray is tachycardic, hypoxic and wheezing throughout and respiratory distress.  bronchodilators, steroids, magnesium  Patient tachycardic, tachypneic on arrival with diffuse wheezing.  Amanda Ray is given bronchodilators, steroids and magnesium.  Amanda Ray chest x-ray is negative.  EKG is sinus tachycardia.  D-dimer is mildly positive but favor asthma exacerbation given Amanda Ray wheezing throughout.  No chest pain or hypoxia. Covid test is negative.  Amanda Ray will be given an hour-long nebulizer and be reassessed after completion.  Anticipate discharge home with bronchodilators and steroids. Care transferred to Dr. Florina Ou at shift change.  Ellarie Picking was evaluated in Emergency Department on 03/10/2019 for the symptoms described in the history of present illness. Amanda Ray was evaluated in the context of the global COVID-19 pandemic, which necessitated consideration that the patient might be at risk for infection with the SARS-CoV-2 virus that causes COVID-19. Institutional protocols and algorithms that pertain to the evaluation of patients at risk for COVID-19 are in a state of rapid change based on information released by regulatory bodies including the CDC and federal and state organizations. These policies and algorithms were followed during the patient's care in the ED.   Final Clinical Impression(s) / ED Diagnoses Final diagnoses:  Moderate persistent asthma with exacerbation    Rx / DC Orders ED Discharge Orders    None       Izzabell Klasen, Annie Main, MD 03/11/19 [phone]

## 2019-03-10 NOTE — ED Notes (Signed)
unsuccessful IV  And blood draw attempt. Second RN to attempt.

## 2019-03-10 NOTE — ED Triage Notes (Signed)
Pt reports SHOB for past 2 days. Pt stated she does not if she has had fevers or chills. Pt reports history of asthma. Pt is very sleeping during communication.  Pt deinies other symptoms at this time.

## 2019-03-11 MED ORDER — ALBUTEROL SULFATE HFA 108 (90 BASE) MCG/ACT IN AERS: 2.0000 | INHALATION_SPRAY | RESPIRATORY_TRACT | Status: DC | PRN

## 2019-03-11 MED ORDER — PREDNISONE 50 MG PO TABS: ORAL_TABLET | ORAL | 0 refills | Status: DC

## 2019-03-11 MED ORDER — ALBUTEROL (5 MG/ML) CONTINUOUS INHALATION SOLN
10.0000 mg/h | INHALATION_SOLUTION | RESPIRATORY_TRACT | Status: DC
  Administered 2019-03-11: 10 mg/h via RESPIRATORY_TRACT
  Filled 2019-03-11: qty 20

## 2019-03-11 MED ORDER — PREDNISONE 20 MG PO TABS: ORAL_TABLET | ORAL | 0 refills | Status: DC

## 2019-03-11 MED ORDER — ALBUTEROL SULFATE HFA 108 (90 BASE) MCG/ACT IN AERS: 1.0000 | INHALATION_SPRAY | Freq: Four times a day (QID) | RESPIRATORY_TRACT | 0 refills | Status: DC | PRN

## 2019-03-11 MED ORDER — IPRATROPIUM BROMIDE 0.02 % IN SOLN
0.5000 mg | Freq: Once | RESPIRATORY_TRACT | Status: AC
  Administered 2019-03-11: 0.5 mg via RESPIRATORY_TRACT
  Filled 2019-03-11: qty 2.5

## 2019-03-11 NOTE — Discharge Instructions (Signed)
Use your inhaler every 4 hours for the next 2 days and then every 4 hours as needed.  Take the steroids as prescribed.  Follow-up with your doctor.  Return to the ED with new or worsening symptoms.

## 2019-03-11 NOTE — ED Notes (Signed)
Pt ambulatory around room without assistance.  Pluse ox  decreased from 100% on RA resting to 90% on room air while ambulating. Pluse Ox did not decrease below 90. Pt did report increased SHOB while ambulating.

## 2019-03-11 NOTE — ED Provider Notes (Signed)
Nursing notes and vitals signs, including pulse oximetry, reviewed.  Summary of this visit's results, reviewed by myself:  EKG:  EKG Interpretation  Date/Time:  Tuesday March 10 2019 20:16:38 EST Ventricular Rate:  133 PR Interval:    QRS Duration: 84 QT Interval:  297 QTC Calculation: 442 R Axis:   113 Text Interpretation: Sinus tachycardia Prolonged PR interval Consider right atrial enlargement Right axis deviation No significant change was found Confirmed by Ezequiel Essex [id number]) on 03/10/2019 11:24:04 PM       Labs:  Results for orders placed or performed during the hospital encounter of 03/10/19 (from the past 24 hour(s))  CBC with Differential/Platelet     Status: Abnormal   Collection Time: 03/10/19  9:05 PM  Result Value Ref Range   WBC 16.1 (H) 4.0 - 10.5 K/uL   RBC 5.66 (H) 3.87 - 5.11 MIL/uL   Hemoglobin 16.7 (H) 12.0 - 15.0 g/dL   HCT 50.3 (H) 36.0 - 46.0 %   MCV 88.9 80.0 - 100.0 fL   MCH 29.5 26.0 - 34.0 pg   MCHC 33.2 30.0 - 36.0 g/dL   RDW 13.4 11.5 - 15.5 %   Platelets 421 (H) 150 - 400 K/uL   nRBC 0.0 0.0 - 0.2 %   Neutrophils Relative % 65 %   Neutro Abs 10.6 (H) 1.7 - 7.7 K/uL   Lymphocytes Relative 14 %   Lymphs Abs 2.2 0.7 - 4.0 K/uL   Monocytes Relative 9 %   Monocytes Absolute 1.4 (H) 0.1 - 1.0 K/uL   Eosinophils Relative 11 %   Eosinophils Absolute 1.7 (H) 0.0 - 0.5 K/uL   Basophils Relative 1 %   Basophils Absolute 0.1 0.0 - 0.1 K/uL   Immature Granulocytes 0 %   Abs Immature Granulocytes 0.05 0.00 - 0.07 K/uL  Basic metabolic panel     Status: Abnormal   Collection Time: 03/10/19  9:05 PM  Result Value Ref Range   Sodium 138 135 - 145 mmol/L   Potassium 4.1 3.5 - 5.1 mmol/L   Chloride 100 98 - 111 mmol/L   CO2 28 22 - 32 mmol/L   Glucose, Bld 157 (H) 70 - 99 mg/dL   BUN 9 6 - 20 mg/dL   Creatinine, Ser 0.48 0.44 - 1.00 mg/dL   Calcium 9.8 8.9 - 10.3 mg/dL   GFR calc non Af Amer >60 >60 mL/min   GFR calc Af Amer >60 >60 mL/min   Anion gap 10 5 - 15  D-dimer, quantitative (not at [facility])     Status: Abnormal   Collection Time: 03/10/19  9:05 PM  Result Value Ref Range   D-Dimer, Quant 0.75 (H) 0.00 - 0.50 ug/mL-FEU  Respiratory Panel by RT PCR (Flu A&B, Covid) - Nasopharyngeal Swab     Status: None   Collection Time: 03/10/19  9:05 PM   Specimen: Nasopharyngeal Swab  Result Value Ref Range   SARS Coronavirus 2 by RT PCR NEGATIVE NEGATIVE   Influenza A by PCR NEGATIVE NEGATIVE   Influenza B by PCR NEGATIVE NEGATIVE  I-Stat Beta hCG blood, ED ([facility], [facility], [facility] only)     Status: None   Collection Time: 03/10/19 10:25 PM  Result Value Ref Range   I-stat hCG, quantitative <5.0 <5 mIU/mL   Comment 3          POC SARS Coronavirus 2 Ag-ED - Nasal Swab (BD Veritor Kit)     Status: None   Collection Time: 03/10/19 10:58 PM  Result Value Ref Range   SARS Coronavirus 2 Ag NEGATIVE NEGATIVE    Imaging Studies: DG Chest Portable 1 View  Result Date: 03/10/2019 CLINICAL DATA:  Shortness of breath EXAM: PORTABLE CHEST 1 VIEW COMPARISON:  01/13/2019 FINDINGS: The heart size and mediastinal contours are within normal limits. Both lungs are clear. The visualized skeletal structures are unremarkable. IMPRESSION: No active disease. Electronically Signed   By: Kevin  Herman M.D.   On: 03/10/2019 20:55   2:09 AM Patient resting comfortably.  Some expiratory wheezing persists but patient is in no distress.  She feels better and does not think she needs an additional neb treatment at this time.  5:41 AM Patient with minimal wheezing and no distress.  Sleeping peacefully but readily awakened.  She has no inhaler so we will provide her a new inhaler.   Molpus, John, MD 03/11/19 0541  

## 2019-03-29 ENCOUNTER — Other Ambulatory Visit: Payer: Self-pay

## 2019-03-29 ENCOUNTER — Encounter (HOSPITAL_BASED_OUTPATIENT_CLINIC_OR_DEPARTMENT_OTHER): Payer: Self-pay | Admitting: *Deleted

## 2019-03-29 ENCOUNTER — Emergency Department (HOSPITAL_BASED_OUTPATIENT_CLINIC_OR_DEPARTMENT_OTHER): Payer: Medicaid - Out of State

## 2019-03-29 ENCOUNTER — Emergency Department (HOSPITAL_BASED_OUTPATIENT_CLINIC_OR_DEPARTMENT_OTHER)
Admission: EM | Admit: 2019-03-29 | Discharge: 2019-03-29 | Disposition: A | Discharge status: Home / Self Care | Payer: Medicaid - Out of State | Attending: Emergency Medicine | Admitting: Emergency Medicine

## 2019-03-29 DIAGNOSIS — R0602 Shortness of breath: Secondary | ICD-10-CM | POA: Diagnosis present

## 2019-03-29 DIAGNOSIS — Z20822 Contact with and (suspected) exposure to covid-19: Secondary | ICD-10-CM | POA: Diagnosis not present

## 2019-03-29 DIAGNOSIS — Z87891 Personal history of nicotine dependence: Secondary | ICD-10-CM | POA: Diagnosis not present

## 2019-03-29 DIAGNOSIS — J45901 Unspecified asthma with (acute) exacerbation: Secondary | ICD-10-CM | POA: Insufficient documentation

## 2019-03-29 LAB — PREGNANCY, URINE: Preg Test, Ur: NEGATIVE

## 2019-03-29 MED ORDER — DOXYCYCLINE HYCLATE 100 MG PO CAPS: 100.0000 mg | ORAL_CAPSULE | Freq: Two times a day (BID) | ORAL | 0 refills | Status: AC

## 2019-03-29 MED ORDER — IPRATROPIUM BROMIDE HFA 17 MCG/ACT IN AERS
INHALATION_SPRAY | RESPIRATORY_TRACT | Status: AC
  Administered 2019-03-29: 14:00:00 2
  Filled 2019-03-29: qty 12.9

## 2019-03-29 MED ORDER — ALBUTEROL SULFATE HFA 108 (90 BASE) MCG/ACT IN AERS
INHALATION_SPRAY | RESPIRATORY_TRACT | Status: AC
  Administered 2019-03-29: 8
  Filled 2019-03-29: qty 6.7

## 2019-03-29 MED ORDER — PREDNISONE 50 MG PO TABS
60.0000 mg | ORAL_TABLET | Freq: Once | ORAL | Status: AC
  Administered 2019-03-29: 60 mg via ORAL
  Filled 2019-03-29: qty 1

## 2019-03-29 MED ORDER — PREDNISONE 20 MG PO TABS: 60.0000 mg | ORAL_TABLET | Freq: Every day | ORAL | 0 refills | Status: AC

## 2019-03-29 NOTE — ED Provider Notes (Signed)
[facility] EMERGENCY DEPARTMENT Provider Note   CSN: [id number] Arrival date & time: 03/29/19  1351     History Chief Complaint  Patient presents with  . Asthma    Amanda Ray is a 34 y.o. female.  The history is provided by the patient.  Shortness of Breath Severity:  Mild Onset quality:  Gradual Duration:  2 days Timing:  Intermittent Progression:  Waxing and waning Chronicity:  Recurrent Context: weather changes   Relieved by:  Inhaler Worsened by:  Exertion Associated symptoms: cough and wheezing   Associated symptoms: no abdominal pain, no chest pain, no claudication, no ear pain, no fever, no rash, no sore throat, no sputum production and no vomiting   Risk factors: no hx of PE/DVT        Past Medical History:  Diagnosis Date  . Abscess   . Asthma   . IV drug abuse (HCC)   . Polysubstance abuse [facility])     Patient Active Problem List   Diagnosis Date Noted  . Bacteremia   . Facial cellulitis 11/13/2018  . Chronic asthma   . Polysubstance abuse [facility])     Past Surgical History:  Procedure Laterality Date  . INCISION AND DRAINAGE ABSCESS N/A 11/16/2018   Procedure: INCISION AND DRAINAGE ABSCESS cheek;  Surgeon: Ocie Doyne, DDS;  Location: [facility] ORS;  Service: Oral Surgery;  Laterality: N/A;  . TEE WITHOUT CARDIOVERSION N/A 11/19/2018   Procedure: TRANSESOPHAGEAL ECHOCARDIOGRAM (TEE);  Surgeon: Little Ishikawa, MD;  Location: [facility] ENDOSCOPY;  Service: Endoscopy;  Laterality: N/A;  . TOOTH EXTRACTION N/A 11/16/2018   Procedure: DENTAL /EXTRACTIONS tooth 4 and 19;  Surgeon: Ocie Doyne, DDS;  Location: [facility] ORS;  Service: Oral Surgery;  Laterality: N/A;     OB History   No obstetric history on file.     No family history on file.  Social History   Tobacco Use  . Smoking status: Former Smoker    Types: Cigarettes  . Smokeless tobacco: Never Used  Substance Use Topics  . Alcohol use: Never  . Drug use: Yes    Types: IV   Comment: narcotic abuse    Home Medications Prior to Admission medications   Medication Sig Start Date End Date Taking? Authorizing Provider  albuterol (VENTOLIN HFA) 108 (90 Base) MCG/ACT inhaler Inhale 1-2 puffs into the lungs every 6 (six) hours as needed for wheezing or shortness of breath. 03/11/19   Rancour, Jeannett Senior, MD  doxycycline (VIBRAMYCIN) 100 MG capsule Take 1 capsule (100 mg total) by mouth 2 (two) times daily for 7 days. 03/29/19 04/05/19  Nyellie Yetter, DO  predniSONE (DELTASONE) 20 MG tablet Take 3 tablets (60 mg total) by mouth daily for 5 days. 03/29/19 04/03/19  Virgina Norfolk, DO    Allergies    Patient has no known allergies.  Review of Systems   Review of Systems  Constitutional: Negative for chills and fever.  HENT: Negative for ear pain and sore throat.   Eyes: Negative for pain and visual disturbance.  Respiratory: Positive for cough, shortness of breath and wheezing. Negative for sputum production.   Cardiovascular: Negative for chest pain, palpitations and claudication.  Gastrointestinal: Negative for abdominal pain and vomiting.  Genitourinary: Negative for dysuria and hematuria.  Musculoskeletal: Negative for arthralgias and back pain.  Skin: Negative for color change and rash.  Neurological: Negative for seizures and syncope.  All other systems reviewed and are negative.   Physical Exam Updated Vital Signs  ED Triage Vitals  Enc Vitals Group     BP 03/29/19 1355 (!) 149/127     Pulse Rate 03/29/19 1355 73     Resp 03/29/19 1355 (!) 22     Temp 03/29/19 1355 98 F (36.7 C)     Temp Source 03/29/19 1355 Oral     SpO2 03/29/19 1355 (!) 88 %     Weight 03/29/19 1359 110 lb (49.9 kg)     Height 03/29/19 1359 5\' 5"  (1.651 m)     Head Circumference --      Peak Flow --      Pain Score 03/29/19 1358 4     Pain Loc --      Pain Edu? --      Excl. in [place]? --     Physical Exam Vitals and nursing note reviewed.  Constitutional:      General: She is  not in acute distress.    Appearance: She is well-developed. She is not ill-appearing.  HENT:     Head: Normocephalic and atraumatic.     Nose: Nose normal.     Mouth/Throat:     Mouth: Mucous membranes are moist.  Eyes:     Extraocular Movements: Extraocular movements intact.     Conjunctiva/sclera: Conjunctivae normal.     Pupils: Pupils are equal, round, and reactive to light.  Cardiovascular:     Rate and Rhythm: Normal rate and regular rhythm.     Heart sounds: No murmur.  Pulmonary:     Effort: Pulmonary effort is normal. No respiratory distress.     Breath sounds: Wheezing present.  Abdominal:     General: There is no distension.     Palpations: Abdomen is soft.     Tenderness: There is no abdominal tenderness.  Musculoskeletal:     Cervical back: Neck supple.     Right lower leg: No edema.     Left lower leg: No edema.  Skin:    General: Skin is warm and dry.     Capillary Refill: Capillary refill takes less than 2 seconds.  Neurological:     General: No focal deficit present.     Mental Status: She is alert.  Psychiatric:        Mood and Affect: Mood normal.     ED Results / Procedures / Treatments   Labs (all labs ordered are listed, but only abnormal results are displayed) Labs Reviewed  NOVEL CORONAVIRUS, NAA (HOSP ORDER, SEND-OUT TO REF LAB; TAT 18-24 HRS)  PREGNANCY, URINE    EKG None  Radiology DG Chest Portable 1 View  Result Date: 03/29/2019 CLINICAL DATA:  Shortness of breath EXAM: PORTABLE CHEST 1 VIEW COMPARISON:  03/10/2019 FINDINGS: Left lower lung opacity, suspicious for pneumonia. Right lung is essentially clear. No pleural effusion or pneumothorax. The heart is normal in size. IMPRESSION: Left lower lung opacity, suspicious for pneumonia. Electronically Signed   By: Julian Hy M.D.   On: 03/29/2019 14:42    Procedures Procedures (including critical care time)  Medications Ordered in ED Medications  albuterol (VENTOLIN HFA) 108  (90 Base) MCG/ACT inhaler (8 puffs  Given 03/29/19 1404)  ipratropium (ATROVENT HFA) 17 MCG/ACT inhaler (2 puffs  Given 03/29/19 1404)  predniSONE (DELTASONE) tablet 60 mg (60 mg Oral Given 03/29/19 1607)    ED Course  I have reviewed the triage vital signs and the nursing notes.  Pertinent labs & imaging results that were available during my care of the patient were reviewed by me and  considered in my medical decision making (see chart for details).    MDM Rules/Calculators/A&P                      Minnie Legros is a 34 year old female with history of asthma, drug abuse who presents to the ED with wheezing, shortness of breath.  Symptoms started last night.  Patient with pulse ox of 80% upon arrival however when rechecked was 95%.  On my evaluation with good waveform patient has 99% on room air.  She is mildly tachycardic but just received albuterol treatment.  She was with normal pulse upon arrival.  No fever.  Denies any infectious symptoms.  No history of coronavirus or known exposures.  States that weather changes and being around people smoking cigarettes last night seems to have made symptoms worse.  X-ray was done prior to my evaluation that is suspicious for possible pneumonia.  Patient feels better after breathing treatment.  Pregnancy test was negative and patient was given a dose of prednisone.  Will observe the patient and have her ambulate with pulse ox.  Anticipate antibiotics for possible pneumonia.  Covid test has been sent for.  Patient is nontoxic-appearing.  Feels improved after albuterol inhaler.  She is able to ambulate with normal pulse ox and mild symptoms.  Still has some mild wheezing on exam.  Will start antibiotics despite patient not having cough or sputum production as possible early infection.  She understands return precautions.  Given prescription for prednisone discharged in the ED in good condition.  This chart was dictated using voice recognition software.  Despite  best efforts to proofread,  errors can occur which can change the documentation meaning.    Final Clinical Impression(s) / ED Diagnoses Final diagnoses:  Mild asthma with exacerbation, unspecified whether persistent    Rx / DC Orders ED Discharge Orders         Ordered    predniSONE (DELTASONE) 20 MG tablet  Daily     03/29/19 1631    doxycycline (VIBRAMYCIN) 100 MG capsule  2 times daily     03/29/19 1631           Virgina Norfolk, DO 03/29/19 1631

## 2019-03-29 NOTE — ED Triage Notes (Signed)
HX of asthma. C/o increased SOB since last night. Using inhaler without relief

## 2019-03-29 NOTE — ED Notes (Signed)
ED Provider at bedside. 

## 2019-03-31 LAB — NOVEL CORONAVIRUS, NAA (HOSP ORDER, SEND-OUT TO REF LAB; TAT 18-24 HRS): SARS-CoV-2, NAA: NOT DETECTED

## 2019-04-10 ENCOUNTER — Inpatient Hospital Stay (HOSPITAL_BASED_OUTPATIENT_CLINIC_OR_DEPARTMENT_OTHER)
Admission: EM | Admit: 2019-04-10 | Discharge: 2019-04-13 | DRG: 917 | Disposition: A | Discharge status: Home / Self Care | Payer: Medicaid - Out of State | Attending: Critical Care Medicine | Admitting: Critical Care Medicine

## 2019-04-10 ENCOUNTER — Emergency Department (HOSPITAL_BASED_OUTPATIENT_CLINIC_OR_DEPARTMENT_OTHER): Payer: Medicaid - Out of State

## 2019-04-10 ENCOUNTER — Encounter (HOSPITAL_BASED_OUTPATIENT_CLINIC_OR_DEPARTMENT_OTHER): Payer: Self-pay | Admitting: *Deleted

## 2019-04-10 DIAGNOSIS — J4541 Moderate persistent asthma with (acute) exacerbation: Secondary | ICD-10-CM

## 2019-04-10 DIAGNOSIS — F419 Anxiety disorder, unspecified: Secondary | ICD-10-CM | POA: Diagnosis not present

## 2019-04-10 DIAGNOSIS — T50901A Poisoning by unspecified drugs, medicaments and biological substances, accidental (unintentional), initial encounter: Secondary | ICD-10-CM | POA: Diagnosis present

## 2019-04-10 DIAGNOSIS — Z978 Presence of other specified devices: Secondary | ICD-10-CM

## 2019-04-10 DIAGNOSIS — G92 Toxic encephalopathy: Secondary | ICD-10-CM | POA: Diagnosis present

## 2019-04-10 DIAGNOSIS — J45901 Unspecified asthma with (acute) exacerbation: Secondary | ICD-10-CM | POA: Diagnosis present

## 2019-04-10 DIAGNOSIS — F1990 Other psychoactive substance use, unspecified, uncomplicated: Secondary | ICD-10-CM

## 2019-04-10 DIAGNOSIS — Z20822 Contact with and (suspected) exposure to covid-19: Secondary | ICD-10-CM | POA: Diagnosis present

## 2019-04-10 DIAGNOSIS — T50904S Poisoning by unspecified drugs, medicaments and biological substances, undetermined, sequela: Secondary | ICD-10-CM | POA: Diagnosis not present

## 2019-04-10 DIAGNOSIS — T401X1A Poisoning by heroin, accidental (unintentional), initial encounter: Principal | ICD-10-CM | POA: Diagnosis present

## 2019-04-10 DIAGNOSIS — J9602 Acute respiratory failure with hypercapnia: Secondary | ICD-10-CM | POA: Diagnosis present

## 2019-04-10 DIAGNOSIS — Z87891 Personal history of nicotine dependence: Secondary | ICD-10-CM | POA: Diagnosis not present

## 2019-04-10 DIAGNOSIS — R4182 Altered mental status, unspecified: Secondary | ICD-10-CM | POA: Diagnosis present

## 2019-04-10 DIAGNOSIS — R569 Unspecified convulsions: Secondary | ICD-10-CM | POA: Diagnosis present

## 2019-04-10 DIAGNOSIS — J9601 Acute respiratory failure with hypoxia: Secondary | ICD-10-CM | POA: Diagnosis present

## 2019-04-10 DIAGNOSIS — R0602 Shortness of breath: Secondary | ICD-10-CM

## 2019-04-10 LAB — COMPREHENSIVE METABOLIC PANEL
ALT: 21 U/L (ref 0–44)
AST: 41 U/L (ref 15–41)
Albumin: 3.7 g/dL (ref 3.5–5.0)
Alkaline Phosphatase: 84 U/L (ref 38–126)
Anion gap: 17 — ABNORMAL HIGH (ref 5–15)
BUN: 8 mg/dL (ref 6–20)
CO2: 20 mmol/L — ABNORMAL LOW (ref 22–32)
Calcium: 9.3 mg/dL (ref 8.9–10.3)
Chloride: 98 mmol/L (ref 98–111)
Creatinine, Ser: 1 mg/dL (ref 0.44–1.00)
GFR calc Af Amer: >60 mL/min (ref >60)
GFR calc non Af Amer: >60 mL/min (ref >60)
Glucose, Bld: 262 mg/dL — ABNORMAL HIGH (ref 70–99)
Potassium: 4.6 mmol/L (ref 3.5–5.1)
Sodium: 135 mmol/L (ref 135–145)
Total Bilirubin: 0.4 mg/dL (ref 0.3–1.2)
Total Protein: 8.3 g/dL — ABNORMAL HIGH (ref 6.5–8.1)

## 2019-04-10 LAB — CBC WITH DIFFERENTIAL/PLATELET
Abs Immature Granulocytes: 0.06 10*3/uL (ref 0.00–0.07)
Basophils Absolute: 0.1 10*3/uL (ref 0.0–0.1)
Basophils Relative: 1 %
Eosinophils Absolute: 3.7 10*3/uL — ABNORMAL HIGH (ref 0.0–0.5)
Eosinophils Relative: 20 %
HCT: 50.6 % — ABNORMAL HIGH (ref 36.0–46.0)
Hemoglobin: 16.8 g/dL — ABNORMAL HIGH (ref 12.0–15.0)
Immature Granulocytes: 0 %
Lymphocytes Relative: 26 %
Lymphs Abs: 4.9 10*3/uL — ABNORMAL HIGH (ref 0.7–4.0)
MCH: 29.3 pg (ref 26.0–34.0)
MCHC: 33.2 g/dL (ref 30.0–36.0)
MCV: 88.2 fL (ref 80.0–100.0)
Monocytes Absolute: 1.6 10*3/uL — ABNORMAL HIGH (ref 0.1–1.0)
Monocytes Relative: 8 %
Neutro Abs: 8.4 10*3/uL — ABNORMAL HIGH (ref 1.7–7.7)
Neutrophils Relative %: 45 %
Platelets: 498 10*3/uL — ABNORMAL HIGH (ref 150–400)
RBC: 5.74 MIL/uL — ABNORMAL HIGH (ref 3.87–5.11)
RDW: 14.7 % (ref 11.5–15.5)
WBC: 18.8 10*3/uL — ABNORMAL HIGH (ref 4.0–10.5)
nRBC: 0 % (ref 0.0–0.2)

## 2019-04-10 LAB — POCT I-STAT 7, (LYTES, BLD GAS, ICA,H+H)
Acid-Base Excess: 3 mmol/L — ABNORMAL HIGH (ref 0.0–2.0)
Bicarbonate: 30.4 mmol/L — ABNORMAL HIGH (ref 20.0–28.0)
Calcium, Ion: 1.29 mmol/L (ref 1.15–1.40)
HCT: 47 % — ABNORMAL HIGH (ref 36.0–46.0)
Hemoglobin: 16 g/dL — ABNORMAL HIGH (ref 12.0–15.0)
O2 Saturation: 100 %
Patient temperature: 97.5
Potassium: 3.9 mmol/L (ref 3.5–5.1)
Sodium: 136 mmol/L (ref 135–145)
TCO2: 32 mmol/L (ref 22–32)
pCO2 arterial: 57 mmHg — ABNORMAL HIGH (ref 32.0–48.0)
pH, Arterial: 7.332 — ABNORMAL LOW (ref 7.350–7.450)
pO2, Arterial: 222 mmHg — ABNORMAL HIGH (ref 83.0–108.0)

## 2019-04-10 LAB — URINALYSIS, ROUTINE W REFLEX MICROSCOPIC
Bilirubin Urine: NEGATIVE
Glucose, UA: NEGATIVE mg/dL
Hgb urine dipstick: NEGATIVE
Ketones, ur: NEGATIVE mg/dL
Leukocytes,Ua: NEGATIVE
Nitrite: NEGATIVE
Protein, ur: NEGATIVE mg/dL
Specific Gravity, Urine: >1.03 — ABNORMAL HIGH (ref 1.005–1.030)
pH: 5.5 (ref 5.0–8.0)

## 2019-04-10 LAB — RAPID URINE DRUG SCREEN, HOSP PERFORMED
Amphetamines: POSITIVE — AB
Barbiturates: NOT DETECTED
Benzodiazepines: NOT DETECTED
Cocaine: NOT DETECTED
Opiates: POSITIVE — AB
Tetrahydrocannabinol: NOT DETECTED

## 2019-04-10 LAB — ETHANOL: Alcohol, Ethyl (B): <10 mg/dL (ref <10)

## 2019-04-10 LAB — SALICYLATE LEVEL: Salicylate Lvl: <7 mg/dL — ABNORMAL LOW (ref 7.0–30.0)

## 2019-04-10 LAB — RESPIRATORY PANEL BY RT PCR (FLU A&B, COVID)
Influenza A by PCR: NEGATIVE
Influenza B by PCR: NEGATIVE
SARS Coronavirus 2 by RT PCR: NEGATIVE

## 2019-04-10 LAB — PREGNANCY, URINE: Preg Test, Ur: NEGATIVE

## 2019-04-10 MED ORDER — METHYLPREDNISOLONE SODIUM SUCC 125 MG IJ SOLR
125.0000 mg | Freq: Once | INTRAMUSCULAR | Status: AC
  Administered 2019-04-10: 125 mg via INTRAVENOUS
  Filled 2019-04-10: qty 2

## 2019-04-10 MED ORDER — NALOXONE HCL 0.4 MG/ML IJ SOLN
INTRAMUSCULAR | Status: AC
  Administered 2019-04-10: 0.4 mg
  Filled 2019-04-10: qty 1

## 2019-04-10 MED ORDER — LORAZEPAM 2 MG/ML IJ SOLN
INTRAMUSCULAR | Status: AC
  Filled 2019-04-10: qty 1

## 2019-04-10 MED ORDER — LORAZEPAM 2 MG/ML IJ SOLN
INTRAMUSCULAR | Status: AC
  Administered 2019-04-10: 19:00:00 2 mg
  Filled 2019-04-10: qty 1

## 2019-04-10 MED ORDER — PROPOFOL 10 MG/ML IV BOLUS
INTRAVENOUS | Status: AC
  Filled 2019-04-10: qty 20

## 2019-04-10 MED ORDER — KETAMINE HCL 10 MG/ML IJ SOLN
2.0000 mg/kg | Freq: Once | INTRAMUSCULAR | Status: AC
  Administered 2019-04-10: 100 mg via INTRAVENOUS
  Filled 2019-04-10: qty 1

## 2019-04-10 MED ORDER — FENTANYL CITRATE (PF) 2500 MCG/50ML IJ SOLN
INTRAMUSCULAR | Status: AC
  Administered 2019-04-10: 2500 ug
  Filled 2019-04-10: qty 50

## 2019-04-10 MED ORDER — ALBUTEROL (5 MG/ML) CONTINUOUS INHALATION SOLN
INHALATION_SOLUTION | RESPIRATORY_TRACT | Status: AC
  Administered 2019-04-10: 3 mg/h
  Filled 2019-04-10: qty 20

## 2019-04-10 MED ORDER — MIDAZOLAM HCL 2 MG/2ML IJ SOLN
INTRAMUSCULAR | Status: AC
  Administered 2019-04-10: 19:00:00 2 mg
  Filled 2019-04-10: qty 2

## 2019-04-10 MED ORDER — ETOMIDATE 2 MG/ML IV SOLN
INTRAVENOUS | Status: AC | PRN
  Administered 2019-04-10: 20 mg via INTRAVENOUS

## 2019-04-10 MED ORDER — ALBUTEROL SULFATE HFA 108 (90 BASE) MCG/ACT IN AERS
8.0000 | INHALATION_SPRAY | Freq: Once | RESPIRATORY_TRACT | Status: AC
  Administered 2019-04-10: 8 via RESPIRATORY_TRACT

## 2019-04-10 MED ORDER — SODIUM CHLORIDE 0.9 % IV SOLN
10.0000 ug/h | INTRAVENOUS | Status: DC
  Administered 2019-04-10: 20:00:00 25 ug/h via INTRAVENOUS
  Filled 2019-04-10: qty 50

## 2019-04-10 MED ORDER — PROPOFOL 1000 MG/100ML IV EMUL
INTRAVENOUS | Status: AC
  Administered 2019-04-10: 5 ug via INTRAVENOUS
  Filled 2019-04-10: qty 100

## 2019-04-10 MED ORDER — PROPOFOL 1000 MG/100ML IV EMUL
5.0000 ug/kg/min | INTRAVENOUS | Status: DC
  Administered 2019-04-10: 50.1 ug/kg/min via INTRAVENOUS
  Filled 2019-04-10 (×2): qty 100

## 2019-04-10 MED ORDER — IPRATROPIUM BROMIDE 0.02 % IN SOLN
RESPIRATORY_TRACT | Status: AC
  Administered 2019-04-10: 19:00:00 0.5 mg
  Filled 2019-04-10: qty 2.5

## 2019-04-10 MED ORDER — ALBUTEROL (5 MG/ML) CONTINUOUS INHALATION SOLN
INHALATION_SOLUTION | RESPIRATORY_TRACT | Status: AC
  Filled 2019-04-10: qty 20

## 2019-04-10 MED ORDER — MAGNESIUM SULFATE 50 % IJ SOLN
1.0000 g | Freq: Once | INTRAMUSCULAR | Status: DC
  Filled 2019-04-10: qty 2

## 2019-04-10 MED ORDER — SUCCINYLCHOLINE CHLORIDE 20 MG/ML IJ SOLN
INTRAMUSCULAR | Status: AC | PRN
  Administered 2019-04-10: 150 mg via INTRAVENOUS

## 2019-04-10 MED ORDER — MAGNESIUM SULFATE IN D5W 1-5 GM/100ML-% IV SOLN
1.0000 g | Freq: Once | INTRAVENOUS | Status: AC
  Administered 2019-04-10: 1 g via INTRAVENOUS
  Filled 2019-04-10: qty 100

## 2019-04-10 MED ORDER — LORAZEPAM 2 MG/ML IJ SOLN
2.0000 mg | Freq: Once | INTRAMUSCULAR | Status: AC
  Administered 2019-04-10: 18:00:00 2 mg via INTRAVENOUS

## 2019-04-10 NOTE — ED Notes (Signed)
Full body movement noted.  Pupils dilated with no response to light.  Ativan given per order at this time.  Currently being bagged.  Sat noted to drop to mid 60's prior to bagging.

## 2019-04-10 NOTE — ED Provider Notes (Addendum)
[facility] EMERGENCY DEPARTMENT Provider Note   CSN: [id number] Arrival date & time: 04/10/19  1747     History Chief Complaint  Patient presents with  . Shortness of Breath    Amanda Ray is a 34 y.o. female.  HPI Patient presents to the emergency department with significant difficulty breathing and altered mental status.  The patient was found in a vehicle front by our ER tech and there is a needle in the floor board in the car with a fresh track marks with blood coming out of it.  The patient was in significant respiratory distress and unable give me much history due to her significant illness.  Patient was seen here several weeks ago for an asthma exacerbation.  It is unclear as to what she may have injected.  She is a heroin user.   Past Medical History:  Diagnosis Date  . Abscess   . Asthma   . IV drug abuse (HCC)   . Polysubstance abuse [facility])     Patient Active Problem List   Diagnosis Date Noted  . Bacteremia   . Facial cellulitis 11/13/2018  . Chronic asthma   . Polysubstance abuse [facility])     Past Surgical History:  Procedure Laterality Date  . INCISION AND DRAINAGE ABSCESS N/A 11/16/2018   Procedure: INCISION AND DRAINAGE ABSCESS cheek;  Surgeon: Ocie Doyne, DDS;  Location: [facility] ORS;  Service: Oral Surgery;  Laterality: N/A;  . TEE WITHOUT CARDIOVERSION N/A 11/19/2018   Procedure: TRANSESOPHAGEAL ECHOCARDIOGRAM (TEE);  Surgeon: Little Ishikawa, MD;  Location[facility] ENDOSCOPY;  Service: Endoscopy;  Laterality: N/A;  . TOOTH EXTRACTION N/A 11/16/2018   Procedure: DENTAL /EXTRACTIONS tooth 4 and 19;  Surgeon: Ocie Doyne, DDS;  Location: [facility] ORS;  Service: Oral Surgery;  Laterality: N/A;     OB History   No obstetric history on file.     No family history on file.  Social History   Tobacco Use  . Smoking status: Former Smoker    Types: Cigarettes  . Smokeless tobacco: Never Used  Substance Use Topics  . Alcohol use: Never  . Drug use:  Yes    Types: IV    Comment: narcotic abuse    Home Medications Prior to Admission medications   Medication Sig Start Date End Date Taking? Authorizing Provider  albuterol (VENTOLIN HFA) 108 (90 Base) MCG/ACT inhaler Inhale 1-2 puffs into the lungs every 6 (six) hours as needed for wheezing or shortness of breath. 03/11/19   Glynn Octave, MD    Allergies    Patient has no known allergies.  Review of Systems   Review of Systems Level 5 caveat applies due to significant illness and altered mental status Physical Exam Updated Vital Signs BP (!) 204/133   Pulse (!) 108   Resp (!) 9   Wt 49.9 kg   SpO2 95%   BMI 18.31 kg/m   Physical Exam Vitals and nursing note reviewed.  Constitutional:      General: She is in acute distress.     Appearance: She is well-developed. She is ill-appearing and diaphoretic.  HENT:     Head: Normocephalic and atraumatic.  Eyes:     Pupils: Pupils are equal, round, and reactive to light.  Cardiovascular:     Rate and Rhythm: Regular rhythm. Tachycardia present.     Heart sounds: Normal heart sounds. No murmur. No friction rub. No gallop.   Pulmonary:     Effort: Tachypnea, accessory muscle usage and respiratory  distress present.     Breath sounds: Decreased breath sounds present.  Abdominal:     General: Bowel sounds are normal. There is no distension.     Palpations: Abdomen is soft.     Tenderness: There is no abdominal tenderness.  Musculoskeletal:     Cervical back: Normal range of motion and neck supple.  Skin:    General: Skin is warm.     Capillary Refill: Capillary refill takes less than 2 seconds.     Findings: No erythema or rash.  Neurological:     Mental Status: She is alert. She is disoriented.     Motor: No abnormal muscle tone.     Coordination: Coordination normal.  Psychiatric:        Behavior: Behavior normal.     ED Results / Procedures / Treatments   Labs (all labs ordered are listed, but only abnormal results  are displayed) Labs Reviewed  CBC WITH DIFFERENTIAL/PLATELET - Abnormal; Notable for the following components:      Result Value   WBC 18.8 (*)    RBC 5.74 (*)    Hemoglobin 16.8 (*)    HCT 50.6 (*)    Platelets 498 (*)    All other components within normal limits  URINALYSIS, ROUTINE W REFLEX MICROSCOPIC  RAPID URINE DRUG SCREEN, HOSP PERFORMED  PREGNANCY, URINE  SALICYLATE LEVEL  ETHANOL  PATHOLOGIST SMEAR REVIEW    EKG EKG Interpretation  Date/Time:  Friday April 10 2019 17:55:29 EST Ventricular Rate:  120 PR Interval:    QRS Duration: 97 QT Interval:  319 QTC Calculation: 451 R Axis:   85 Text Interpretation: Sinus tachycardia Right atrial enlargement ST elev, probable normal early repol pattern Artifact in lead(s) I II III aVR aVL aVF V1 V2 V3 V4 V5 V6 No significant change since 03/10/2019 Confirmed by Veryl Speak [phone]) on 04/10/2019 6:06:02 PM   Radiology No results found.  Procedures Procedures (including critical care time)  Medications Ordered in ED Medications  albuterol (VENTOLIN) (5 MG/ML) 0.5% continuous inhalation solution (has no administration in time range)  ipratropium (ATROVENT) 0.02 % nebulizer solution (has no administration in time range)  LORazepam (ATIVAN) 2 MG/ML injection (has no administration in time range)  midazolam (VERSED) 2 MG/2ML injection (has no administration in time range)  naloxone (NARCAN) 0.4 MG/ML injection (0.4 mg  Given 04/10/19 1804)  LORazepam (ATIVAN) injection 2 mg (2 mg Intravenous Given 04/10/19 1818)  etomidate (AMIDATE) injection (20 mg Intravenous Given 04/10/19 1825)  succinylcholine (ANECTINE) injection (150 mg Intravenous Given 04/10/19 1826)  propofol (DIPRIVAN) 1000 MG/100ML infusion (5 mcg  New Bag/Given 04/10/19 1837)    ED Course  I have reviewed the triage vital signs and the nursing notes.  Pertinent labs & imaging results that were available during my care of the patient were reviewed by me and considered in  my medical decision making (see chart for details).   Was called into the room to evaluate the patient upon her arrival she appeared very lethargic breathing very rapidly diaphoretic and pale.  The patient had a area where she had injected into her left forearm that was bleeding.  I felt with the patient's increasing lethargy and declining mental status that we would give her 0.4 of Narcan.  The patient did respond and became more alert and she is also placed on a nonrebreather which did help her breathing as well.  Patient was moved to another room and shortly after this move the patient became more  lethargic and started to seize I was called into the room and witnessed seizure-like activity with foaming and clenching of the jaw.  The patient was given 2 mg of Ativan at that point.  It was decided to at this point intubate the patient to maintain her airway and help assist with her ventilations.  The patient was intubated by Dr. Judd Lien.  MDM Rules/Calculators/A&P                      CRITICAL CARE Performed by: Jamesetta Orleans Caelie [place] Total critical care time: 45 minutes Critical care time was exclusive of separately billable procedures and treating other patients. Critical care was necessary to treat or prevent imminent or life-threatening deterioration. Critical care was time spent personally by me on the following activities: development of treatment plan with patient and/or surrogate as well as nursing, discussions with consultants, evaluation of patient's response to treatment, examination of patient, obtaining history from patient or surrogate, ordering and performing treatments and interventions, ordering and review of laboratory studies, ordering and review of radiographic studies, pulse oximetry and re-evaluation of patient's condition.    Final Clinical Impression(s) / ED Diagnoses Final diagnoses:  None    Rx / DC Orders ED Discharge Orders    None       Charlestine Night,  PA-C 04/10/19 1956    [address], Cristal Deer, PA-C 04/10/19 2013    Geoffery Lyons, MD 04/10/19 [phone]

## 2019-04-10 NOTE — ED Notes (Signed)
At 1815 patient transferred to room #11 to be given 43m Albuterol/ 0.562mAtrovent. Pt on 100% NRB. After narcan given by RN patient become more tachypnea, diaphoretic and foaming at mouth. MD DeLo brought to room and setup for intubation. Patient bagged with 100% NRB.  Pt was intubated with 7.5 Mac ETT @ 25cm @ the right lip with ETT holder in place. Pt placed on VT 500, RR 12, 100% and Peep 5. Ventilator to be weaned.

## 2019-04-10 NOTE — Progress Notes (Signed)
Patients peak pressure on the ventilator dropped to the low 20s and her WOB has decreased after 8 puffs of albuterol.  Patient still has inspiratory and expiratory wheezes but is moving more air and ventilating easier.

## 2019-04-10 NOTE — ED Notes (Signed)
Narcan given.  Patient started moaning and states what did you give me.  Now not responding.  Provider made aware and at the bedside.

## 2019-04-10 NOTE — ED Triage Notes (Signed)
Asthma. SOB. No relief with her inhaler. Diaphoretic. Pale. Labored. Oxygen saturations 82 R/A

## 2019-04-10 NOTE — ED Notes (Signed)
  Patients boyfriend called to check on patient because he is out of town.  Boyfriend was worried that whoever dropped her off had her "stuff".  Cell phone number was given but he is not primary contact.  Edwyna Shell [phone]

## 2019-04-11 ENCOUNTER — Inpatient Hospital Stay (HOSPITAL_COMMUNITY): Payer: Medicaid - Out of State

## 2019-04-11 DIAGNOSIS — T50901A Poisoning by unspecified drugs, medicaments and biological substances, accidental (unintentional), initial encounter: Secondary | ICD-10-CM | POA: Diagnosis present

## 2019-04-11 DIAGNOSIS — J4541 Moderate persistent asthma with (acute) exacerbation: Secondary | ICD-10-CM

## 2019-04-11 DIAGNOSIS — F1990 Other psychoactive substance use, unspecified, uncomplicated: Secondary | ICD-10-CM

## 2019-04-11 DIAGNOSIS — T50904S Poisoning by unspecified drugs, medicaments and biological substances, undetermined, sequela: Secondary | ICD-10-CM

## 2019-04-11 DIAGNOSIS — Z978 Presence of other specified devices: Secondary | ICD-10-CM

## 2019-04-11 DIAGNOSIS — R569 Unspecified convulsions: Secondary | ICD-10-CM

## 2019-04-11 LAB — BASIC METABOLIC PANEL
Anion gap: 12 (ref 5–15)
BUN: 6 mg/dL (ref 6–20)
CO2: 25 mmol/L (ref 22–32)
Calcium: 8.7 mg/dL — ABNORMAL LOW (ref 8.9–10.3)
Chloride: 102 mmol/L (ref 98–111)
Creatinine, Ser: 0.72 mg/dL (ref 0.44–1.00)
GFR calc Af Amer: >60 mL/min (ref >60)
GFR calc non Af Amer: >60 mL/min (ref >60)
Glucose, Bld: 161 mg/dL — ABNORMAL HIGH (ref 70–99)
Potassium: 4.5 mmol/L (ref 3.5–5.1)
Sodium: 139 mmol/L (ref 135–145)

## 2019-04-11 LAB — PHOSPHORUS: Phosphorus: 3.9 mg/dL (ref 2.5–4.6)

## 2019-04-11 LAB — MAGNESIUM: Magnesium: 2.4 mg/dL (ref 1.7–2.4)

## 2019-04-11 LAB — CBC
HCT: 45.7 % (ref 36.0–46.0)
Hemoglobin: 15.1 g/dL — ABNORMAL HIGH (ref 12.0–15.0)
MCH: 29.3 pg (ref 26.0–34.0)
MCHC: 33 g/dL (ref 30.0–36.0)
MCV: 88.6 fL (ref 80.0–100.0)
Platelets: 199 10*3/uL (ref 150–400)
RBC: 5.16 MIL/uL — ABNORMAL HIGH (ref 3.87–5.11)
RDW: 13.5 % (ref 11.5–15.5)
WBC: 15 10*3/uL — ABNORMAL HIGH (ref 4.0–10.5)
nRBC: 0 % (ref 0.0–0.2)

## 2019-04-11 LAB — MRSA PCR SCREENING: MRSA by PCR: NEGATIVE

## 2019-04-11 LAB — PROCALCITONIN: Procalcitonin: 0.16 ng/mL

## 2019-04-11 MED ORDER — DEXMEDETOMIDINE HCL IN NACL 400 MCG/100ML IV SOLN
0.0000 ug/kg/h | INTRAVENOUS | Status: DC
  Administered 2019-04-11: 0.4 ug/kg/h via INTRAVENOUS
  Administered 2019-04-11: 13:00:00 0.5 ug/kg/h via INTRAVENOUS
  Administered 2019-04-12: 02:00:00 0.8 ug/kg/h via INTRAVENOUS
  Filled 2019-04-11 (×4): qty 100

## 2019-04-11 MED ORDER — FENTANYL 2500MCG IN NS 250ML (10MCG/ML) PREMIX INFUSION
0.0000 ug/h | INTRAVENOUS | Status: DC
  Administered 2019-04-11: 02:00:00 75 ug/h via INTRAVENOUS

## 2019-04-11 MED ORDER — BUDESONIDE 0.25 MG/2ML IN SUSP
0.2500 mg | Freq: Four times a day (QID) | RESPIRATORY_TRACT | Status: DC
  Administered 2019-04-11: 08:00:00 0.25 mg via RESPIRATORY_TRACT
  Filled 2019-04-11: qty 2

## 2019-04-11 MED ORDER — METHYLPREDNISOLONE SODIUM SUCC 125 MG IJ SOLR
60.0000 mg | Freq: Four times a day (QID) | INTRAMUSCULAR | Status: DC
  Administered 2019-04-11 – 2019-04-12 (×6): 60 mg via INTRAVENOUS
  Filled 2019-04-11 (×6): qty 2

## 2019-04-11 MED ORDER — ORAL CARE MOUTH RINSE
15.0000 mL | OROMUCOSAL | Status: DC
  Administered 2019-04-11 (×7): 15 mL via OROMUCOSAL

## 2019-04-11 MED ORDER — LACTATED RINGERS IV SOLN: INTRAVENOUS | Status: DC

## 2019-04-11 MED ORDER — HYDROMORPHONE HCL 1 MG/ML IJ SOLN
INTRAMUSCULAR | Status: AC
  Administered 2019-04-11: 07:00:00 2 mg
  Filled 2019-04-11: qty 2

## 2019-04-11 MED ORDER — CHLORHEXIDINE GLUCONATE CLOTH 2 % EX PADS
6.0000 | MEDICATED_PAD | Freq: Every day | CUTANEOUS | Status: DC
  Administered 2019-04-13: 6 via TOPICAL

## 2019-04-11 MED ORDER — CHLORHEXIDINE GLUCONATE 0.12% ORAL RINSE (MEDLINE KIT)
15.0000 mL | Freq: Two times a day (BID) | OROMUCOSAL | Status: DC
  Administered 2019-04-11 (×2): 15 mL via OROMUCOSAL

## 2019-04-11 MED ORDER — MIDAZOLAM HCL 2 MG/2ML IJ SOLN
2.0000 mg | Freq: Once | INTRAMUSCULAR | Status: AC | PRN
  Administered 2019-04-11: 03:00:00 2 mg via INTRAVENOUS

## 2019-04-11 MED ORDER — BUDESONIDE 0.5 MG/2ML IN SUSP
0.5000 mg | Freq: Two times a day (BID) | RESPIRATORY_TRACT | Status: DC
  Administered 2019-04-11 – 2019-04-13 (×4): 0.5 mg via RESPIRATORY_TRACT
  Filled 2019-04-11 (×4): qty 2

## 2019-04-11 MED ORDER — METHYLPREDNISOLONE SODIUM SUCC 40 MG IJ SOLR
40.0000 mg | Freq: Every day | INTRAMUSCULAR | Status: DC
  Filled 2019-04-11: qty 1

## 2019-04-11 MED ORDER — ALBUTEROL SULFATE (2.5 MG/3ML) 0.083% IN NEBU
2.5000 mg | INHALATION_SOLUTION | RESPIRATORY_TRACT | Status: DC
  Administered 2019-04-11 (×2): 2.5 mg via RESPIRATORY_TRACT
  Filled 2019-04-11 (×2): qty 3

## 2019-04-11 MED ORDER — MIDAZOLAM HCL 2 MG/2ML IJ SOLN
INTRAMUSCULAR | Status: AC
  Filled 2019-04-11: qty 2

## 2019-04-11 MED ORDER — MIDAZOLAM HCL 2 MG/2ML IJ SOLN
2.0000 mg | Freq: Once | INTRAMUSCULAR | Status: AC | PRN
  Administered 2019-04-11: 2 mg via INTRAVENOUS
  Filled 2019-04-11: qty 2

## 2019-04-11 MED ORDER — HYDROMORPHONE HCL 1 MG/ML IJ SOLN
2.0000 mg | Freq: Once | INTRAMUSCULAR | Status: AC | PRN
  Administered 2019-04-11: 2 mg via INTRAVENOUS
  Filled 2019-04-11: qty 2

## 2019-04-11 MED ORDER — CHLORHEXIDINE GLUCONATE CLOTH 2 % EX PADS: 6.0000 | MEDICATED_PAD | Freq: Every day | CUTANEOUS | Status: DC

## 2019-04-11 MED ORDER — ENOXAPARIN SODIUM 40 MG/0.4ML ~~LOC~~ SOLN
40.0000 mg | SUBCUTANEOUS | Status: DC
  Administered 2019-04-11: 40 mg via SUBCUTANEOUS
  Filled 2019-04-11: qty 0.4

## 2019-04-11 MED ORDER — FAMOTIDINE IN NACL 20-0.9 MG/50ML-% IV SOLN
20.0000 mg | Freq: Two times a day (BID) | INTRAVENOUS | Status: DC
  Administered 2019-04-11 – 2019-04-12 (×4): 20 mg via INTRAVENOUS
  Filled 2019-04-11 (×5): qty 50

## 2019-04-11 MED ORDER — LACTATED RINGERS IV BOLUS
1000.0000 mL | Freq: Once | INTRAVENOUS | Status: AC
  Administered 2019-04-11: 1000 mL via INTRAVENOUS

## 2019-04-11 MED ORDER — IPRATROPIUM BROMIDE 0.02 % IN SOLN
0.5000 mg | Freq: Four times a day (QID) | RESPIRATORY_TRACT | Status: DC
  Administered 2019-04-11 (×2): 0.5 mg via RESPIRATORY_TRACT
  Filled 2019-04-11 (×2): qty 2.5

## 2019-04-11 MED ORDER — HYDROMORPHONE HCL 1 MG/ML IJ SOLN: 2.0000 mg | Freq: Once | INTRAMUSCULAR | Status: AC | PRN

## 2019-04-11 MED ORDER — IPRATROPIUM-ALBUTEROL 0.5-2.5 (3) MG/3ML IN SOLN
3.0000 mL | Freq: Four times a day (QID) | RESPIRATORY_TRACT | Status: DC
  Administered 2019-04-11 – 2019-04-12 (×4): 3 mL via RESPIRATORY_TRACT
  Filled 2019-04-11 (×4): qty 3

## 2019-04-11 NOTE — Procedures (Signed)
Extubation Procedure Note  Patient Details:   Name: Amanda Ray DOB: [birth year] MRN: [id number]   Airway Documentation:    Vent end date: (not recorded) Vent end time: (not recorded)   Evaluation  O2 sats: stable throughout Complications: No apparent complications Patient did tolerate procedure well. Bilateral Breath Sounds: Diminished   Yes   Order received for extubation.  Patient with positive leak test prior to extubation.  Placed patient on 3l [place].  Patient able to vocalize post extubation.  No stridor noted; no complications noted.  Lysbeth [facility] 04/11/2019, 11:10 AM

## 2019-04-11 NOTE — Progress Notes (Signed)
EEG complete - results pending 

## 2019-04-11 NOTE — Plan of Care (Signed)
  Problem: Clinical Measurements: Goal: Cardiovascular complication will be avoided Outcome: Progressing   

## 2019-04-11 NOTE — Progress Notes (Signed)
Pt extubated and doing ok on [place] at this time.

## 2019-04-11 NOTE — H&P (Addendum)
NAME:  Amanda Ray, MRN:  [id number], DOB:  [birth year], LOS: 1 ADMISSION DATE:  04/10/2019, CONSULTATION DATE:  04/11/19 REFERRING MD:  Lorenso Courier, CHIEF COMPLAINT:  Respiratory Failure   Brief History   34 year old female with past medical history of asthma and polysubstance abuse who was found altered in vehicle outside [facility] with fresh track marks and needle in the car.  Was initially altered but improved with Narcan and had worsening dyspnea and approximately 5 minutes of seizure-like activity that responded to Ativan.  She was intubated and transferred to [facility]  History of present illness   Amanda Ray is a 34 year old female with past medical history of polysubstance abuse and asthma who has 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  She has been living from place to place, and most recently was at home with a lot of drug use.  Per ED notes, patient was found in a vehicle with fresh track marks in her forearm, a needle in the car and very altered.  She was given 0.4 Narcan with some improvement but then respiratory status worsened with oxygen saturations of 82% and she had 5 minutes of seizure-like activity that resolved with Ativan, so she was subsequently intubated.  She has had no further seizure activity, labs were significant for leukocytosis of 18 K, urine drug screen positive for opiates and amphetamines, and CT head was negative for acute findings.  She was transferred to [facility] for ICU level care.  Past Medical History   has a past medical history of Abscess, Asthma, IV drug abuse (HCC), and Polysubstance abuse (HCC).  Significant Hospital Events   3/5 presented to [facility] 3/6 admit to [facility]  Consults:    Procedures:  3/5 ETT  Significant Diagnostic Tests:  3/5 CT head>> no acute findings 3/5 CXR>>Nonspecific mild bronchitis without pneumonia  Micro Data:  3/5 SARS-Covid-2>>  negative 3/5 blood cultures x2>>  Antimicrobials:    Interim history/subjective:  Patient given ketamine for unclear reasons during transport, arrives sedated but hemodynamically stable  Objective   Blood pressure 108/74, pulse (!) 126, temperature (!) 97.5 F (36.4 C), temperature source Axillary, resp. rate (!) 21, height 5\' 5"  (1.651 m), weight 49.9 kg, SpO2 99 %.    Vent Mode: PRVC FiO2 (%):  [40 %-60 %] 40 % Set Rate:  [12 bmp-14 bmp] 14 bmp Vt Set:  [450 mL-500 mL] 450 mL PEEP:  [5 cmH20] 5 cmH20 Plateau Pressure:  [15 cmH20-25 cmH20] 25 cmH20  No intake or output data in the 24 hours ending 04/11/19 0139 Filed Weights   04/10/19 1753  Weight: 49.9 kg   General:  Thin, well developed F, sedated and intubated HEENT: MM pink/moist, ETT in place Neuro: Sedated on propofol, no further seizure-like activity, pupils 1 mm bilaterally, breathing over the vent, otherwise unresponsive CV: s1s2 tachycardiac, no m/r/g PULM: Expiratory wheezing bilaterally in all lung fields GI: soft, bsx4 active  Extremities: warm/dry, no edema  Skin: no rashes or lesions   Resolved Hospital Problem list     Assessment & Plan:    Acute hypoxic respiratory failure, Asthma exacerbation -In the setting of seizure and IV drug abuse P: -Continue full ventilator support -Received Solu-Medrol 125 mg in the ED, continues to have significant wheezing so we will continue 40 mg daily scheduled albuterol nebulizers -No distinct infiltrate on CXR, obtain procalcitonin if develops fever or infiltrate start  antibiotic therapy -Maintain full vent support with SAT/SBT as tolerated -titrate Vent setting to maintain SpO2 greater than or equal to 90%. -HOB elevated 30 degrees. -Plateau pressures less than 30 cm H20.  -Follow chest x-ray, ABG prn.   -Bronchial hygiene and RT/bronchodilator protocol.   Encephalopathy, likely secondary to heroin overdose, polysubstance abuse -Sedated with 100 mg of ketamine  during transport for unclear reason -CT head without acute findings P: -Titrate propofol to maintain RASS of 0 to -1, pt at risk for later withdrawal -Will need SW consult when critical illness improved -Daily SAT   Seizure-like activity -5 minutes in the ED, resolved with Ativan 2 mg x 1 -No history of seizures per patient's mother P: -Given no further seizure activity will hold loading with Keppra, obtain EEG and consider neurology consult      Best practice:  Diet: N.p.o. Pain/Anxiety/Delirium protocol (if indicated): Propofol VAP protocol (if indicated): HOB 30 degrees, daily SBT DVT prophylaxis: Lovenox GI prophylaxis: Pepcid Glucose control: SSI Mobility: Rest Code Status: Full code Family Communication: Patient's mother updated by phone Disposition: ICU  Labs   CBC: Recent Labs  Lab 04/10/19 1800 04/10/19 2025  WBC 18.8*  --   NEUTROABS 8.4*  --   HGB 16.8* 16.0*  HCT 50.6* 47.0*  MCV 88.2  --   PLT 498*  --     Basic Metabolic Panel: Recent Labs  Lab 04/10/19 1908 04/10/19 2025  NA 135 136  K 4.6 3.9  CL 98  --   CO2 20*  --   GLUCOSE 262*  --   BUN 8  --   CREATININE 1.00  --   CALCIUM 9.3  --    GFR: Estimated Creatinine Clearance: 64.2 mL/min (by C-G formula based on SCr of 1 mg/dL). Recent Labs  Lab 04/10/19 1800  WBC 18.8*    Liver Function Tests: Recent Labs  Lab 04/10/19 1908  AST 41  ALT 21  ALKPHOS 84  BILITOT 0.4  PROT 8.3*  ALBUMIN 3.7   No results for input(s): LIPASE, AMYLASE in the last 168 hours. No results for input(s): AMMONIA in the last 168 hours.  ABG    Component Value Date/Time   PHART 7.332 (L) 04/10/2019 2025   PCO2ART 57.0 (H) 04/10/2019 2025   PO2ART 222.0 (H) 04/10/2019 2025   HCO3 30.4 (H) 04/10/2019 2025   TCO2 32 04/10/2019 2025   O2SAT 100.0 04/10/2019 2025     Coagulation Profile: No results for input(s): INR, PROTIME in the last 168 hours.  Cardiac Enzymes: No results for input(s):  CKTOTAL, CKMB, CKMBINDEX, TROPONINI in the last 168 hours.  HbA1C: Hgb A1c MFr Bld  Date/Time Value Ref Range Status  11/16/2018 03:53 AM 6.1 (H) 4.8 - 5.6 % Final    Comment:    (NOTE) Pre diabetes:          5.7%-6.4% Diabetes:              >6.4% Glycemic control for   <7.0% adults with diabetes     CBG: No results for input(s): GLUCAP in the last 168 hours.  Review of Systems:   Unable to obtain secondary to altered mental status  Past Medical History  She,  has a past medical history of Abscess, Asthma, IV drug abuse (HCC), and Polysubstance abuse (HCC).   Surgical History    Past Surgical History:  Procedure Laterality Date  . INCISION AND DRAINAGE ABSCESS N/A 11/16/2018   Procedure: INCISION AND DRAINAGE ABSCESS cheek;  Surgeon: Diona Browner, DDS;  Location: [facility] ORS;  Service: Oral Surgery;  Laterality: N/A;  . TEE WITHOUT CARDIOVERSION N/A 11/19/2018   Procedure: TRANSESOPHAGEAL ECHOCARDIOGRAM (TEE);  Surgeon: [name], MD;  Location: [facility] ENDOSCOPY;  Service: Endoscopy;  Laterality: N/A;  . TOOTH EXTRACTION N/A 11/16/2018   Procedure: DENTAL /EXTRACTIONS tooth 4 and 19;  Surgeon: Diona Browner, DDS;  Location: [facility] ORS;  Service: Oral Surgery;  Laterality: N/A;     Social History   reports that she has quit smoking. Her smoking use included cigarettes. She has never used smokeless tobacco. She reports current drug use. Drug: IV. She reports that she does not drink alcohol.   Family History   Her family history is not on file.   Allergies No Known Allergies   Home Medications  Prior to Admission medications   Medication Sig Start Date End Date Taking? Authorizing Provider  albuterol (VENTOLIN HFA) 108 (90 Base) MCG/ACT inhaler Inhale 1-2 puffs into the lungs every 6 (six) hours as needed for wheezing or shortness of breath. 03/11/19   Ezequiel Essex, MD     Critical care time: 62 minutes       CRITICAL CARE Performed by: Otilio Carpen  Latia Mataya   Total critical care time: 62 minutes  Critical care time was exclusive of separately billable procedures and treating other patients.  Critical care was necessary to treat or prevent imminent or life-threatening deterioration.  Critical care was time spent personally by me on the following activities: development of treatment plan with patient and/or surrogate as well as nursing, discussions with consultants, evaluation of patient's response to treatment, examination of patient, obtaining history from patient or surrogate, ordering and performing treatments and interventions, ordering and review of laboratory studies, ordering and review of radiographic studies, pulse oximetry and re-evaluation of patient's condition.   Otilio Carpen Claudius Mich, PA-C

## 2019-04-11 NOTE — Procedures (Addendum)
Patient Name: Amanda Ray  MRN: [id number]  Epilepsy Attending: Charlsie Quest  Referring Physician/Provider: Emilie Rutter, PA Date: 04/11/2019  Duration: 23.45 mins  Patient history: 34 yr old F w/ PMHx Asthma , IVDA ( heroin and  prev documented with h/o injecting in her neck) now w/ fresh track marks presented w/ acute mental status change, SOB and seizure like activity. EEG to evaluate for seizure.   Level of alertness: awake, asleep  AEDs during EEG study: Versed  Technical aspects: This EEG study was done with scalp electrodes positioned according to the 10-20 International system of electrode placement. Electrical activity was acquired at a sampling rate of 500Hz  and reviewed with a high frequency filter of 70Hz  and a low frequency filter of 1Hz . EEG data were recorded continuously and digitally stored.   DESCRIPTION: During awake state, eeg showed continuous generalized 3-6hz  theta-delta slowing. Sleep was characterized by vertex waves, sleep spindles (13-15Hz ), maximal frontocentral.  Hyperventilation and photic stimulation were not performed.  ABNORMALITY - Continuous slow, generalized   IMPRESSION: This study is suggestive of moderate diffuse encephalopathy, non specific et etiology.  No seizures or epileptiform discharges were seen throughout the recording.  [name] 

## 2019-04-11 NOTE — Progress Notes (Signed)
PCCM OVERNIGHT   On re-evaluation at 0250 with PA Gleason  Pt is much more awake, eyes track, purposeful on exam Requiring soft wrist restraints protect from self harm/inadvertent extubation On mechanical ventilation: FiO2 40% PEEP 5  Suspect that later this AM she may tolerate SBT and possibly extubation Appears restless no myoclonic/seizure activity  Was given Versed 2 mg IV with no effect per RN Was on continuous fentanyl at 75 with no effect Given her h/o IVDU suspect high tolerance of sedative and analgesic medications  Plan: Versed 2 mg IV push x1 Dilaudid 2 mg push x 1 Start on Precedex gtt Will reassess as needed  .Marland KitchenSigned Dr Newell Coral Pulmonary Critical Care Locums

## 2019-04-11 NOTE — Plan of Care (Signed)
  Problem: Coping: Goal: Level of anxiety will decrease Outcome: Not Progressing   

## 2019-04-11 NOTE — Progress Notes (Signed)
eLink Physician-Brief Progress Note Patient Name: Amanda Ray DOB: [birth year] MRN: [id number]   Date of Service  04/11/2019  HPI/Events of Note  87 F Hx of IVDU and asthma found unresponsive in car outside [facility] with needle and fresh track marks. She was given narcan with improvement but had seizure episode and was intubated for airway protection.  Intubated AC 14/450/40%/5 PEEP, peak pressure 20, MV 11   eICU Interventions   Keep intubated until more awake and cooperative  Bronchodilators and systemic steroids for asthma     Intervention Category Major Interventions: Respiratory failure - evaluation and management;Change in mental status - evaluation and management;Seizures - evaluation and management Evaluation Type: New Patient Evaluation  Darl Pikes 04/11/2019, 12:23 AM

## 2019-04-12 DIAGNOSIS — F419 Anxiety disorder, unspecified: Secondary | ICD-10-CM

## 2019-04-12 MED ORDER — IPRATROPIUM-ALBUTEROL 0.5-2.5 (3) MG/3ML IN SOLN: 3.0000 mL | Freq: Four times a day (QID) | RESPIRATORY_TRACT | Status: DC | PRN

## 2019-04-12 MED ORDER — CLONIDINE HCL 0.1 MG PO TABS
0.1000 mg | ORAL_TABLET | Freq: Two times a day (BID) | ORAL | Status: DC
  Administered 2019-04-12 – 2019-04-13 (×3): 0.1 mg via ORAL
  Filled 2019-04-12 (×3): qty 1

## 2019-04-12 NOTE — Progress Notes (Addendum)
NAME:  Amanda Ray, MRN:  [id number], DOB:  [birth year], LOS: 2 ADMISSION DATE:  04/10/2019, CONSULTATION DATE:  04/12/19 REFERRING MD:  Lorenso Courier, CHIEF COMPLAINT:  Respiratory Failure   Brief History   34 year old female with past medical history of asthma and polysubstance abuse who was found altered in vehicle outside [facility] with fresh track marks and needle in the car.  Was initially altered but improved with Narcan and had worsening dyspnea and approximately 5 minutes of seizure-like activity that responded to Ativan.  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  She has been living from place to place, and most recently was at home with a lot of drug use.  Per ED notes, patient was found in a vehicle with fresh track marks in her forearm, a needle in the car and very altered.  She was given 0.4 Narcan with some improvement but then respiratory status worsened with oxygen saturations of 82% and she had 5 minutes of seizure-like activity that resolved with Ativan, so she was subsequently intubated.  She has had no further seizure activity, labs were significant for leukocytosis of 18 K, urine drug screen positive for opiates and amphetamines, and CT head was negative for acute findings.  She was transferred to [facility] for ICU level care.  Past Medical History   has a past medical history of Abscess, Asthma, IV drug abuse (HCC), and Polysubstance abuse (HCC).  Significant Hospital Events   3/5 presented to [facility] 3/6 admit to [facility]  Consults:    Procedures:  3/5 ETT  Significant Diagnostic Tests:  3/5 CT head>> no acute findings 3/5 CXR>>Nonspecific mild bronchitis without pneumonia  Micro Data:  3/5 SARS-Covid-2>>  negative 3/5 blood cultures x2>>  Antimicrobials:    Interim history/subjective:  3/7: pt extubated yesterday and cont to do ok. Still on precedex infusion. Will start clonidine to wean. On RA and without wheeze. Anticipate d/c as early as tomorrow if able to wean off precedex.  3/6:Patient given ketamine for unclear reasons during transport, arrives sedated but hemodynamically stable  Objective   Blood pressure (!) 100/57, pulse 98, temperature 100.1 F (37.8 C), temperature source Oral, resp. rate (!) 24, height 5\' 5"  (1.651 m), weight 53.5 kg, SpO2 100 %.        Intake/Output Summary (Last 24 hours) at 04/12/2019 1011 Last data filed at 04/12/2019 0900 Gross per 24 hour  Intake 775.16 ml  Output 950 ml  Net -174.84 ml   Filed Weights   04/10/19 1753 04/11/19 0217 04/12/19 0434  Weight: 49.9 kg 53.5 kg 53.5 kg   General:  Thin, well developed F, resting supine in bed easily arousable HEENT: MM pink/moist, ETT in place Neuro: awake conversant, no deficits.  CV: s1s2 tachycardiac, no m/r/g PULM: ctab GI: soft, bsx4 active  Extremities: warm/dry, no edema  Skin: no rashes or lesions   Resolved Hospital Problem list     Assessment & Plan:    Acute hypoxic respiratory failure, Asthma exacerbation -In the setting of seizure and IV drug abuse P: -extuabted on ra -stop scheduled duonebs -stop IV steroids.   -Bronchial hygiene and RT/bronchodilator protocol.   Encephalopathy, likely secondary to heroin overdose, polysubstance abuse -Sedated with 100  mg of ketamine during transport for unclear reason -CT head without acute findings P: -resolved  Anxiety:  -titrate precedex to rass 0 -add clonidine to wean precedex.  -pt states she takes this at home but dose is inappropriate at 4mg . Unable to verify meds as she states last time she used this was in [place].    Seizure-like activity -5 minutes in the ED, resolved with Ativan 2 mg x 1 -No history of seizures per  patient's mother P: -eeg without sz.  -resolved, likely 2/2 acute events.      Best practice:  Diet: advance diet as tolerated Pain/Anxiety/Delirium protocol (if indicated): precedex infusion VAP protocol (if indicated): HOB 30 degrees, daily SBT DVT prophylaxis: Lovenox GI prophylaxis: Pepcid Glucose control: SSI Mobility: Rest Code Status: Full code Family Communication: Patient's mother updated by phone Disposition: ICU while on precedex  Labs   CBC: Recent Labs  Lab 04/10/19 1800 04/10/19 2025 04/11/19 0244  WBC 18.8*  --  15.0*  NEUTROABS 8.4*  --   --   HGB 16.8* 16.0* 15.1*  HCT 50.6* 47.0* 45.7  MCV 88.2  --  88.6  PLT 498*  --  199    Basic Metabolic Panel: Recent Labs  Lab 04/10/19 1908 04/10/19 2025 04/11/19 0244  NA 135 136 139  K 4.6 3.9 4.5  CL 98  --  102  CO2 20*  --  25  GLUCOSE 262*  --  161*  BUN 8  --  6  CREATININE 1.00  --  0.72  CALCIUM 9.3  --  8.7*  MG  --   --  2.4  PHOS  --   --  3.9   GFR: Estimated Creatinine Clearance: 86.1 mL/min (by C-G formula based on SCr of 0.72 mg/dL). Recent Labs  Lab 04/10/19 1800 04/11/19 0244 04/11/19 0623  PROCALCITON  --   --  0.16  WBC 18.8* 15.0*  --     Liver Function Tests: Recent Labs  Lab 04/10/19 1908  AST 41  ALT 21  ALKPHOS 84  BILITOT 0.4  PROT 8.3*  ALBUMIN 3.7   No results for input(s): LIPASE, AMYLASE in the last 168 hours. No results for input(s): AMMONIA in the last 168 hours.  ABG    Component Value Date/Time   PHART 7.332 (L) 04/10/2019 2025   PCO2ART 57.0 (H) 04/10/2019 2025   PO2ART 222.0 (H) 04/10/2019 2025   HCO3 30.4 (H) 04/10/2019 2025   TCO2 32 04/10/2019 2025   O2SAT 100.0 04/10/2019 2025     Coagulation Profile: No results for input(s): INR, PROTIME in the last 168 hours.  Cardiac Enzymes: No results for input(s): CKTOTAL, CKMB, CKMBINDEX, TROPONINI in the last 168 hours.  HbA1C: Hgb A1c MFr Bld  Date/Time Value Ref Range Status    11/16/2018 03:53 AM 6.1 (H) 4.8 - 5.6 % Final    Comment:    (NOTE) Pre diabetes:          5.7%-6.4% Diabetes:              >6.4% Glycemic control for   <7.0% adults with diabetes     CBG: No results for input(s): GLUCAP in the last 168 hours.    Critical care time: The patient is critically ill with multiple organ systems failure and requires high complexity decision making for assessment and support, frequent evaluation and titration of therapies, application of advanced monitoring technologies and extensive interpretation of multiple databases.  Critical care time 32 mins. This represents  my time independent of the NPs time taking care of the pt. This is excluding procedures.    Audria Nine DO  Pulmonary and Critical Care 04/12/2019, 10:12 AM

## 2019-04-13 ENCOUNTER — Other Ambulatory Visit: Payer: Self-pay

## 2019-04-13 MED ORDER — ONDANSETRON HCL 4 MG/2ML IJ SOLN
4.0000 mg | INTRAMUSCULAR | Status: DC | PRN
  Administered 2019-04-13: 4 mg via INTRAVENOUS
  Filled 2019-04-13: qty 2

## 2019-04-13 NOTE — Progress Notes (Signed)
Pt discharged and transported downstairs.

## 2019-04-13 NOTE — Progress Notes (Signed)
Patient ID: Amanda Ray, female   DOB: [birth year], 34 y.o.   MRN: [id number] Virtual Visit via Telephone Note  I connected with Amanda Ray on 04/15/19 at  9:10 AM EST by telephone and verified that I am speaking with the correct person using two identifiers.   I discussed the limitations, risks, security and privacy concerns of performing an evaluation and management service by telephone and the availability of in person appointments. I also discussed with the patient that there may be a patient responsible charge related to this service. The patient expressed understanding and agreed to proceed.  PATIENT visit by telephone virtually in the context of Covid-19 pandemic. Patient location: home My Location:  [facility] office Persons on the call:  Me and the patient   History of Present Illness: After hospitalization 04/10/2019-04/13/2019 after heroin overdose and respiratory failure.  She is doing well.  Awaiting a bed at [facility] treatment center for substance abuse.  No falls. No SI/HI.  Breathing is much better.  She also has information for zoom NA meetings.  Some swelling and tenderness at IV site.  Slightly red.  No fever.  No CP.    From discharge summary: Discharge Diagnoses:  Acute hypoxic respiratory failure, Asthma exacerbation Encephalopathy, likely secondary to heroin overdose, polysubstance abuse Anxiety:  Seizure-like activity                                                      DISCHARGE PLAN BY DIAGNOSIS    Discharge Plan: Acute hypoxic respiratory failure, Asthma exacerbation, resolved  -In the setting of seizure and IV drug abuse -Extubated to RA 3/6 P: Patient remains stable on RA post extubation  Patient is encouraged to continue frequent pulmonary hygiene at discharge   Encephalopathy, likely secondary to heroin overdose, polysubstance abuse, resolved  -Sedated with 100 mg of ketamine during transport for unclear reason -CT head without acute findings P: Patient  remains alert and oriented x4 this morning   Anxiety:  P: Patient is encouraged to establish care with primary care physician who can help manage anxiety in the outpatient setting   Seizure-like activity, resolved  -5 minutes in the ED, resolved with Ativan 2 mg x 1 -No history of seizures per patient's mother -eeg without sz                                        DISCHARGE SUMMARY  Amanda Ray is a 34 year old female with past medical history of polysubstance abuse and asthma who has 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 She has been living from place to place, and most recently was at home with a lot of drug use. Per ED notes, patient was found in a vehicle with fresh track marks in her forearm, a needle in the car and very altered. She was given 0.4 Narcan with some improvement but then respiratory status worsened with oxygen saturations of 82% and she had 5 minutes of seizure-like activity that resolved with Ativan, so she was subsequently intubated. She has had no further seizure activity, labs were significant for leukocytosis of 18 K, urine drug screen positive for opiates and amphetamines, and CT head was negative for  acute findings. She was transferred to [facility] for ICU level care.  During admission patient was support with ventilator for acute hypoxic respiratory failure in the setting of IV drug abuse with eventual extubation 3/6. Post extubation patient was seen  Encephalopathic that was treated with a precedex drip, this was stopped by the afternoon of 3/7. By 3/8 she is back to baseline mentation, tolerating oral intake and oxygenation well on RA. She is experience some mild withdrawal symptoms including nausea and vomiting but this has been controlled with antiemetics. She expresses interest in quitting illicit drug abuse and TOC team will be consulted for substance abuse resources prior to discharge.     Observations/Objective:  NAD.  Speech is clear/coherent.  A&Ox3   Assessment and Plan: 1. Cellulitis of right hand Will cover for infection if needed at IV site - cephALEXin (KEFLEX) 500 MG capsule; Take 1 capsule (500 mg total) by mouth 3 (three) times daily.  Dispense: 21 capsule; Refill: 0 - fluconazole (DIFLUCAN) 150 MG tablet; Take 1 tablet (150 mg total) by mouth once for 1 dose.  Dispense: 1 tablet; Refill: 0  2. Mild intermittent chronic asthma without complication - albuterol (VENTOLIN HFA) 108 (90 Base) MCG/ACT inhaler; Inhale 1-2 puffs into the lungs every 6 (six) hours as needed for wheezing or shortness of breath.  Dispense: 6.7 g; Refill: 0  3. Drug overdose, undetermined intent, sequela -[facility] I have counseled the patient at length about substance abuse and addiction.  12 step meetings/recovery recommended.  Local 12 step meeting lists were given and attendance was encouraged.  Patient expresses understanding.  - Ambulatory referral to Social Work  4. Hospital discharge follow-up Doing well    Follow Up Instructions: Assign PCP in 6-8 weeks   I discussed the assessment and treatment plan with the patient. The patient was provided an opportunity to ask questions and all were answered. The patient agreed with the plan and demonstrated an understanding of the instructions.   The patient was advised to call back or seek an in-person evaluation if the symptoms worsen or if the condition fails to improve as anticipated.  I provided 12 minutes of non-face-to-face time during this encounter.   Amanda Caldron, PA-C

## 2019-04-13 NOTE — Discharge Summary (Addendum)
Physician Discharge Summary  Patient ID: Amanda Ray MRN: [id number] DOB/AGE: [birth year] 34 y.o.  Admit date: 04/10/2019 Discharge date: 04/13/2019    Discharge Diagnoses:  Acute hypoxic respiratory failure, Asthma exacerbation Encephalopathy, likely secondary to heroin overdose, polysubstance abuse Anxiety:  Seizure-like activity                     DISCHARGE PLAN BY DIAGNOSIS    Discharge Plan: Acute hypoxic respiratory failure, Asthma exacerbation, resolved  -In the setting of seizure and IV drug abuse -Extubated to RA 3/6 P: Patient remains stable on RA post extubation  Patient is encouraged to continue frequent pulmonary hygiene at discharge   Encephalopathy, likely secondary to heroin overdose, polysubstance abuse, resolved  -Sedated with 100 mg of ketamine during transport for unclear reason -CT head without acute findings P: Patient remains alert and oriented x4 this morning   Anxiety:  P: Patient is encouraged to establish care with primary care physician who can help manage anxiety in the outpatient setting   Seizure-like activity, resolved  -5 minutes in the ED, resolved with Ativan 2 mg x 1 -No history of seizures per patient's mother -eeg without sz                  DISCHARGE SUMMARY  Amanda Ray is a 34 year old female with past medical history of polysubstance abuse and asthma who has 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  She has been living from place to place, and most recently was at home with a lot of drug use.  Per ED notes, patient was found in a vehicle with fresh track marks in her forearm, a needle in the car and very altered.  She was given 0.4 Narcan with some improvement but then respiratory status worsened with oxygen saturations of 82% and she had 5 minutes of seizure-like activity that resolved with Ativan, so she was subsequently intubated.  She has had no further seizure activity, labs were significant for  leukocytosis of 18 K, urine drug screen positive for opiates and amphetamines, and CT head was negative for acute findings.  She was transferred to [facility] for ICU level care.  During admission patient was support with ventilator for acute hypoxic respiratory failure in the setting of IV drug abuse with eventual extubation 3/6. Post extubation patient was seen  Encephalopathic that was treated with a precedex drip, this was stopped by the afternoon of 3/7. By 3/8 she is back to baseline mentation, tolerating oral intake and oxygenation well on RA. She is experience some mild withdrawal symptoms including nausea and vomiting but this has been controlled with antiemetics. She expresses interest in quitting illicit drug abuse and TOC team will be consulted for substance abuse resources prior to discharge.    SIGNIFICANT DIAGNOSTIC STUDIES 3/5 CT head>> no acute findings 3/5 CXR>>Nonspecific mild bronchitis without pneumonia  MICRO DATA  3/5 SARS-Covid-2>> negative 3/5 blood cultures x2>>NTD  ANTIBIOTICS None  CONSULTS None  TUBES / LINES ETT 3/5-3/6   Discharge Exam: General: VEry thin adult female, lying in bed in mild discomfort  HEENT: [place]/AT, MM pink/moist, PERRL,  Neuro: Alert and oriented x3 CV: s1s2 regular rate and rhythm, no murmur, rubs, or gallops,  PULM:  Clear to ascultation bilaterally  GI: soft, bowel sounds active in all 4 quadrants, non-tender, non-distended Extremities: warm/dry, no edema  Skin: no rashes or lesions    Vitals:   04/13/19  0800 04/13/19 0900 04/13/19 1000 04/13/19 1030  BP: 129/80 (!) 136/91 (!) 133/92   Pulse: 90 (!) 58 69 71  Resp: (!) 28 (!) 35 (!) 27 (!) 28  Temp: 98.5 F (36.9 C)     TempSrc: Axillary     SpO2: (!) 89% 96% 96% 99%  Weight:      Height:         Discharge Labs  BMET Recent Labs  Lab 04/10/19 1908 04/10/19 1908 04/10/19 2025 04/11/19 0244  NA 135  --  136 139  K 4.6   < > 3.9 4.5  CL 98  --   --   102  CO2 20*  --   --  25  GLUCOSE 262*  --   --  161*  BUN 8  --   --  6  CREATININE 1.00  --   --  0.72  CALCIUM 9.3  --   --  8.7*  MG  --   --   --  2.4  PHOS  --   --   --  3.9   < > = values in this interval not displayed.    CBC Recent Labs  Lab 04/10/19 1800 04/10/19 2025 04/11/19 0244  HGB 16.8* 16.0* 15.1*  HCT 50.6* 47.0* 45.7  WBC 18.8*  --  15.0*  PLT 498*  --  199    Anti-Coagulation No results for input(s): INR in the last 168 hours.        Allergies as of 04/13/2019   No Known Allergies     Medication List    TAKE these medications   albuterol 108 (90 Base) MCG/ACT inhaler Commonly known as: VENTOLIN HFA Inhale 1-2 puffs into the lungs every 6 (six) hours as needed for wheezing or shortness of breath.   cloNIDine 0.1 MG tablet Commonly known as: CATAPRES Take 0.1 mg by mouth 2 (two) times daily.       Disposition: Home   Discharged Condition: [name] has met maximum benefit of inpatient care and is medically stable and cleared for discharge.  Patient is pending follow up as above.     Time spent on disposition:  28 Minutes.   Signed: Johnsie Cancel, NP-C [facility] Contact / Pager information can be found on Amion  04/13/2019, 11:32 AM

## 2019-04-14 ENCOUNTER — Telehealth: Payer: Self-pay

## 2019-04-14 LAB — PATHOLOGIST SMEAR REVIEW

## 2019-04-14 NOTE — Telephone Encounter (Signed)
Transition Care Management Follow-up Telephone Call Date of discharge and from where: 04/13/2019, [facility]   Attempted to contact the patient # [phone], message states that the number is not in service.   The patient has an appointment at [facility] tomorrow - 04/15/2019 @ 0910.  The phone number for [facility] is on her discharge AVS.

## 2019-04-15 ENCOUNTER — Ambulatory Visit: Payer: Medicaid - Out of State | Attending: Family Medicine | Admitting: Physician Assistant

## 2019-04-15 ENCOUNTER — Other Ambulatory Visit: Payer: Self-pay

## 2019-04-15 DIAGNOSIS — Z09 Encounter for follow-up examination after completed treatment for conditions other than malignant neoplasm: Secondary | ICD-10-CM

## 2019-04-15 DIAGNOSIS — J4520 Mild intermittent asthma, uncomplicated: Secondary | ICD-10-CM

## 2019-04-15 DIAGNOSIS — L03113 Cellulitis of right upper limb: Secondary | ICD-10-CM

## 2019-04-15 DIAGNOSIS — T50904S Poisoning by unspecified drugs, medicaments and biological substances, undetermined, sequela: Secondary | ICD-10-CM

## 2019-04-15 DIAGNOSIS — T401X4S Poisoning by heroin, undetermined, sequela: Secondary | ICD-10-CM

## 2019-04-15 MED ORDER — CEPHALEXIN 500 MG PO CAPS: 500.0000 mg | ORAL_CAPSULE | Freq: Three times a day (TID) | ORAL | 0 refills | Status: AC

## 2019-04-15 MED ORDER — ALBUTEROL SULFATE HFA 108 (90 BASE) MCG/ACT IN AERS: 1.0000 | INHALATION_SPRAY | Freq: Four times a day (QID) | RESPIRATORY_TRACT | 0 refills | Status: AC | PRN

## 2019-04-15 MED ORDER — FLUCONAZOLE 150 MG PO TABS: 150.0000 mg | ORAL_TABLET | Freq: Once | ORAL | 0 refills | Status: AC

## 2019-04-16 LAB — CULTURE, BLOOD (ROUTINE X 2)
Culture: NO GROWTH
Culture: NO GROWTH
Special Requests: ADEQUATE

## 2019-05-06 ENCOUNTER — Telehealth: Payer: Self-pay | Admitting: Licensed Clinical Social Worker

## 2019-05-06 NOTE — Telephone Encounter (Signed)
Call placed to patient regarding [facility] referral. Phone continued to ring without option for leaving a message.

## 2019-05-27 ENCOUNTER — Telehealth: Payer: Self-pay | Admitting: Licensed Clinical Social Worker

## 2019-05-27 NOTE — Telephone Encounter (Signed)
Call placed regarding [facility] referral. There was no option for LCSW to leave message for a return call.

## 2019-06-24 ENCOUNTER — Telehealth (HOSPITAL_BASED_OUTPATIENT_CLINIC_OR_DEPARTMENT_OTHER): Payer: Self-pay

## 2019-06-24 NOTE — Telephone Encounter (Signed)
This RN Called pt to make her aware that she had left a white tank top, pink shirt, and black sweat pants at ED, mother answered phone and advised that pt is currently incarcerated and we should just throw away the belongings.

## 2020-04-10 IMAGING — CT CT MAXILLOFACIAL W/ CM
3 series · 14 of 47 positions shown, 16 images · IV contrast (omnipaque)
Comparison: No pertinent prior studies available for comparison.

CLINICAL DATA: Cellulitis of face. Additional history provided:
Patient reports temple to right side of face since yesterday,
swelling started last night.

EXAM:
CT MAXILLOFACIAL WITH CONTRAST
TECHNIQUE: Multidetector CT imaging of the maxillofacial structures was
performed with intravenous contrast. Multiplanar CT image
reconstructions were also generated.
CONTRAST:  75mL OMNIPAQUE IOHEXOL 300 MG/ML  SOLN

[Series 3: max soft · axial · 0.31mm/px · z∈[-264,-116]mm · 8 of 86 slices shown, 10 images]
[im 6/86  brain]
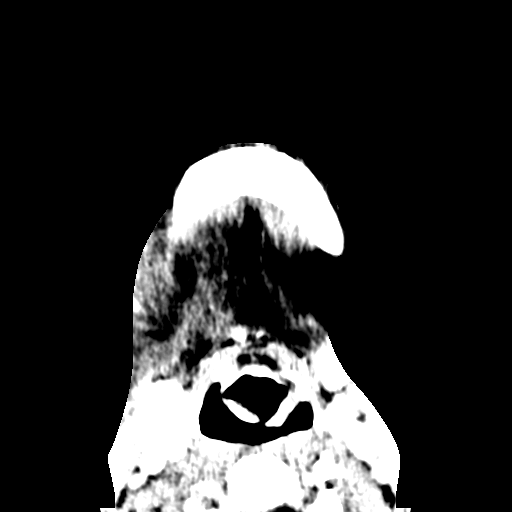
[im 6/86  bone]
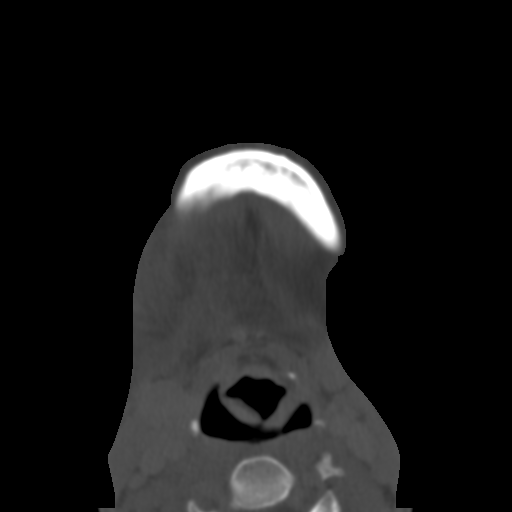
[im 18/86  bone]
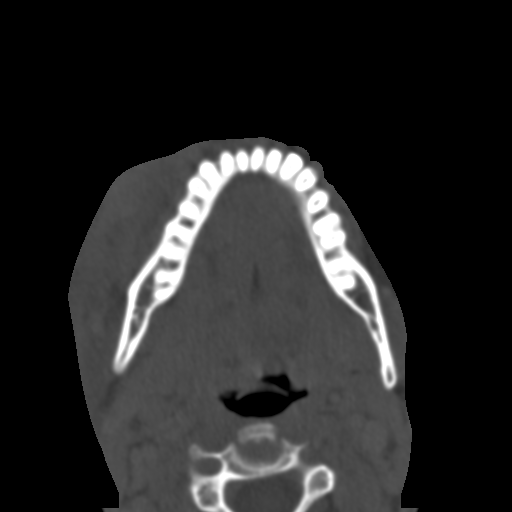
[im 27/86  bone]
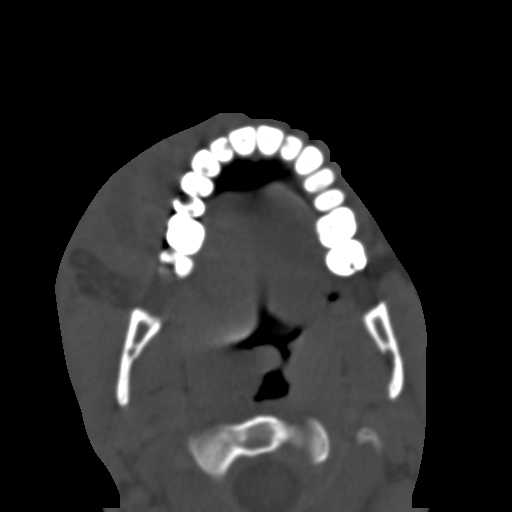
[im 39/86  bone]
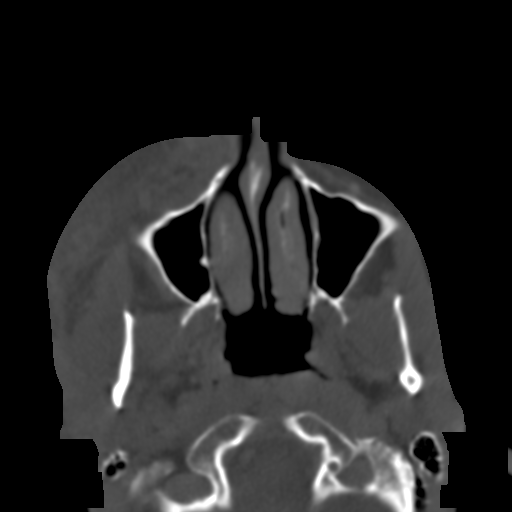
[im 47/86  brain]
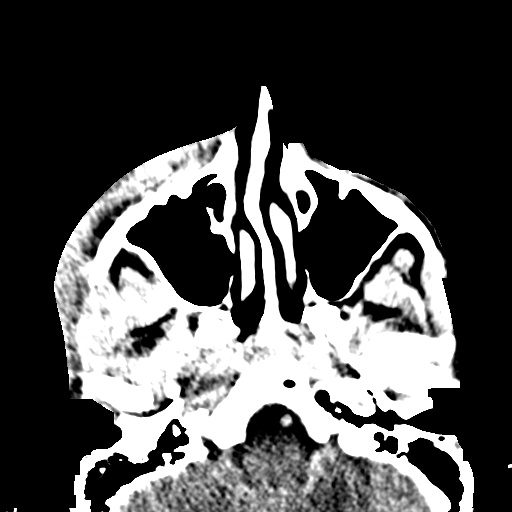
[im 47/86  bone]
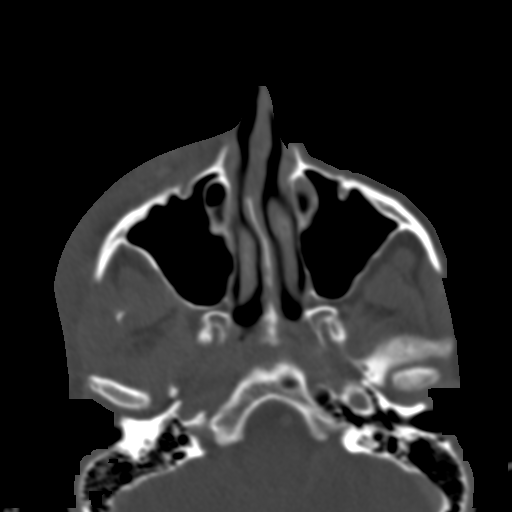
[im 59/86  bone]
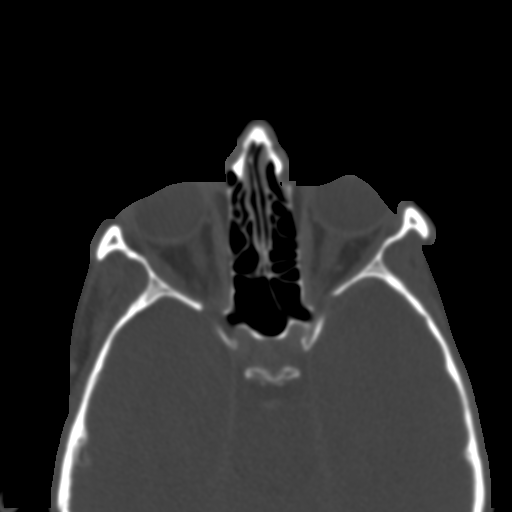
[im 68/86  bone]
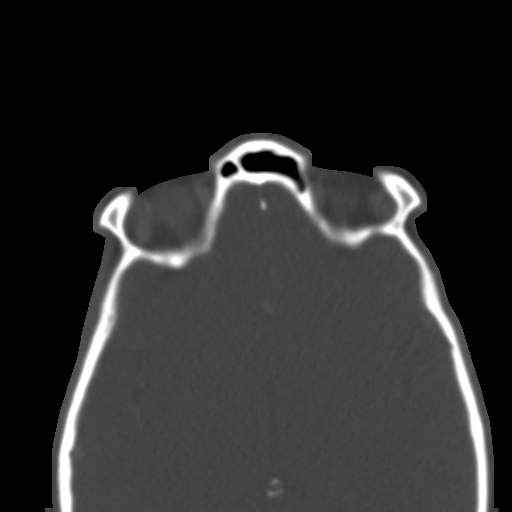
[im 80/86  bone]
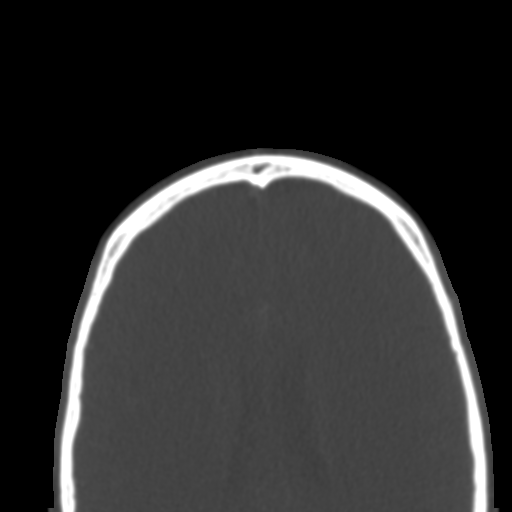

[Series 7: coronal soft · coronal · 0.29mm/px · 3 of 73 slices shown]
[im 25/73  bone]
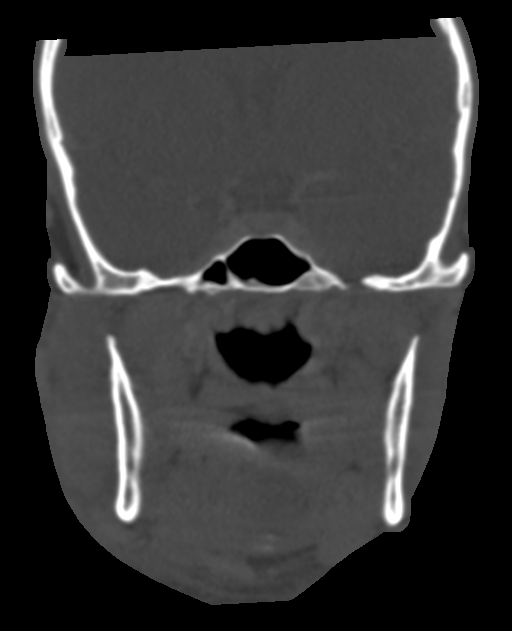
[im 33/73  bone]
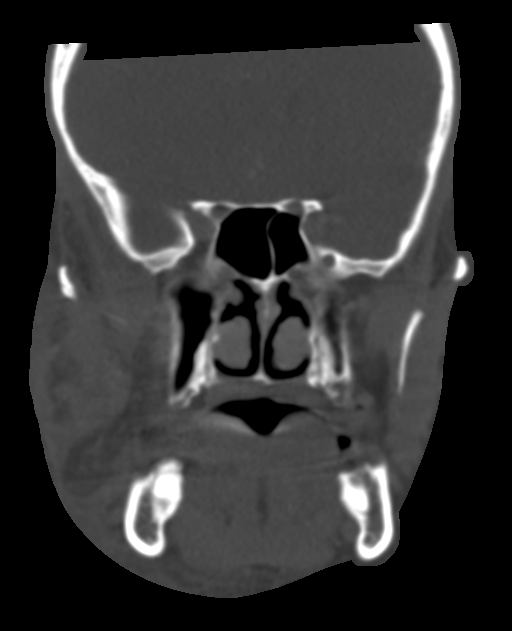
[im 41/73  bone]
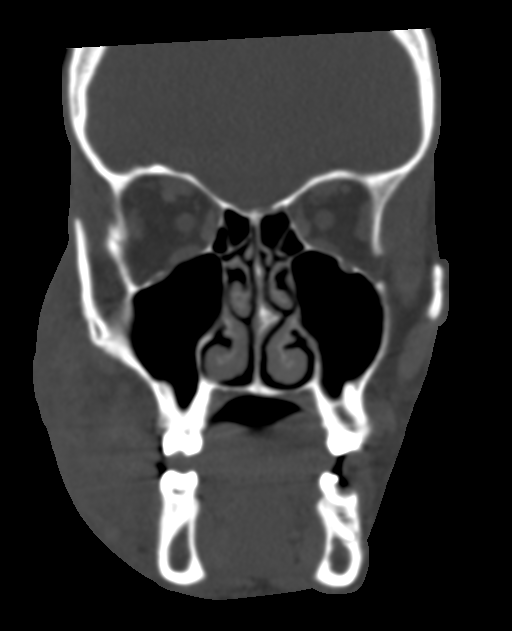

[Series 8: sagittal soft · sagittal · 0.28mm/px · 3 of 75 slices shown]
[im 25/75  bone]
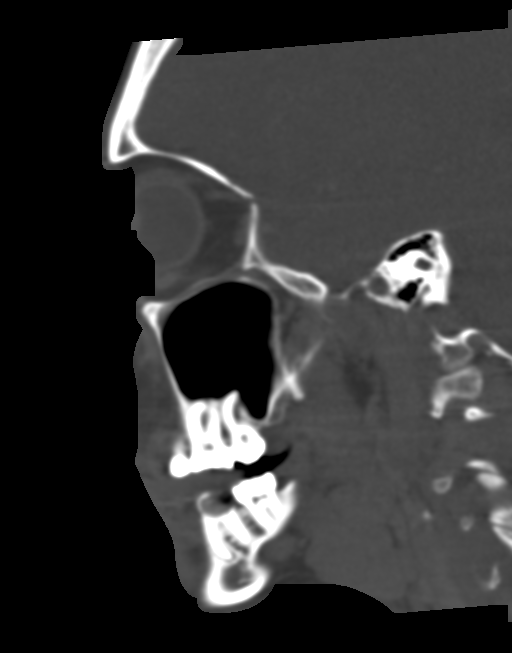
[im 38/75  bone]
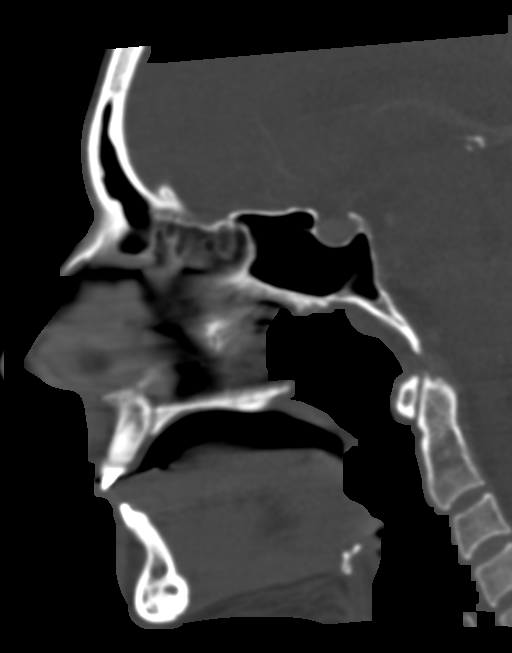
[im 50/75  bone]
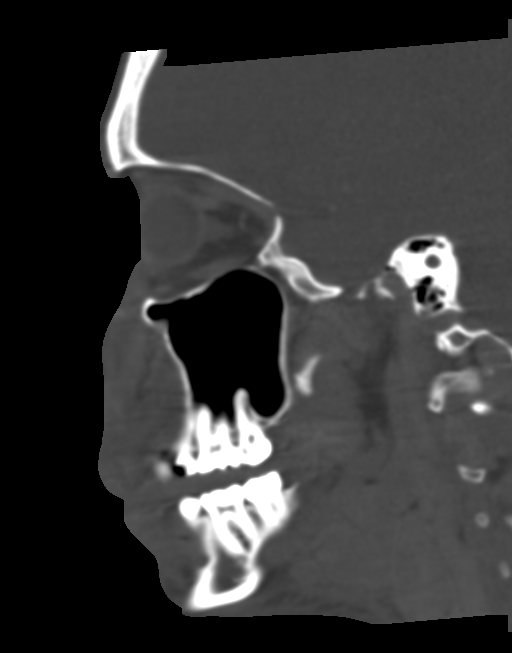

[14 of 47 positions shown; findings below may reference images not displayed]

FINDINGS: Osseous:

There are multiple carious teeth. Subtle periapical lucency
surrounding the left lower second premolar and first molar.

Orbits: Right periorbital cellulitis. No definite evidence of
postseptal extension on the current exam.

Sinuses: No significant paranasal sinus disease or mastoid effusion.

Soft tissues:

There is severe right maxillofacial soft tissue swelling and
stranding consistent with cellulitis which extends to the right
periorbital region. There is a subtle circumscribed low-attenuation
region with peripheral enhancement in the right cheek soft tissues
measuring 2.8 x 1.0 x 1.2 cm (AP x TV x CC) (series 3, image 60)
(series 7, image 30). This may reflect very early abscess formation
and extends anteriorly toward the right cheek skin surface (series
3, image 65). Cellulitis changes also extend to the right
perimandibular region and upper right neck.

Asymmetric prominence of the right parotid gland may reflect a
reactive parotiditis.

No appreciable mass or swelling within the nasopharynx, oral cavity,
oropharynx, hypopharynx or larynx.

Enlarged right cervical chain lymph nodes, likely reactive. An index
right level II lymph node measures 16 mm in short axis

Limited intracranial: Unremarkable
IMPRESSION: 1. Severe right maxillofacial cellulitis extending to the right
periorbital, right perimandibular and right upper neck soft tissues.
No evidence of postseptal orbital extension on the current
examination.
2. 2.8 cm subtle region of low attenuation with peripheral
enhancement in the right cheek soft tissues, as described and which
may reflect very early abscess formation.
3. Asymmetric prominence of the right parotid gland, which may
reflect reactive parotiditis. Clinical correlation is recommended.
4. Right upper cervical lymphadenopathy, likely reactive.

## 2020-08-05 IMAGING — DX DG CHEST 1V PORT
1 series · 1 of 1 positions shown · non-contrast
Comparison: 01/13/2019

CLINICAL DATA: Shortness of breath

EXAM:
PORTABLE CHEST 1 VIEW

[chest ap]
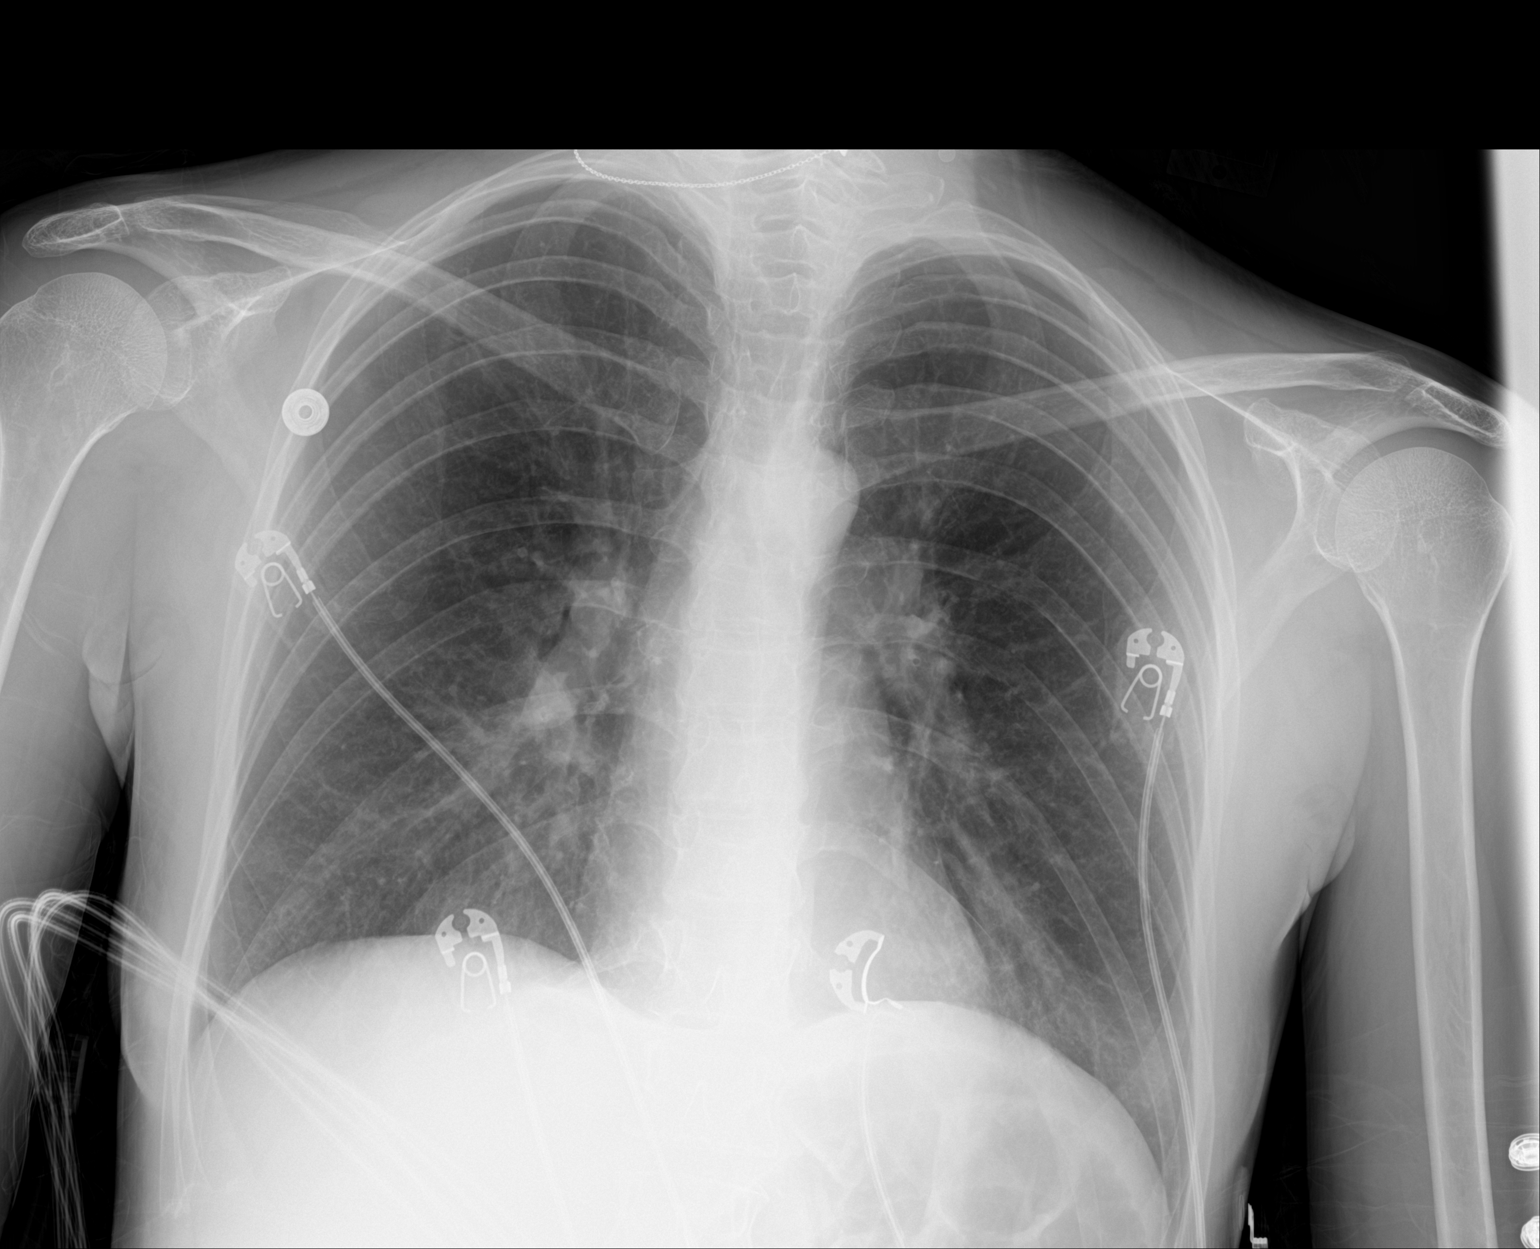

[1 of 1 positions shown; findings below may reference images not displayed]

FINDINGS: The heart size and mediastinal contours are within normal limits.
Both lungs are clear. The visualized skeletal structures are
unremarkable.
IMPRESSION: No active disease.
